# Patient Record
Sex: Male | Born: 1960 | Race: Black or African American | Hispanic: No | Marital: Married | Smoking: Never smoker
Health system: Southern US, Community
[De-identification: ages and names within clinical notes are randomized; demographics above are authoritative.]

## PROBLEM LIST (undated history)

## (undated) DIAGNOSIS — A159 Respiratory tuberculosis unspecified: Secondary | ICD-10-CM

## (undated) HISTORY — DX: Respiratory tuberculosis unspecified: A15.9

---

## 1999-03-13 ENCOUNTER — Emergency Department (HOSPITAL_COMMUNITY): Admission: EM | Admit: 1999-03-13 | Discharge: 1999-03-13 | Payer: Self-pay | Admitting: Emergency Medicine

## 1999-03-13 ENCOUNTER — Encounter: Payer: Self-pay | Admitting: Emergency Medicine

## 2000-04-06 ENCOUNTER — Emergency Department (HOSPITAL_COMMUNITY): Admission: EM | Admit: 2000-04-06 | Discharge: 2000-04-06 | Payer: Self-pay | Admitting: Internal Medicine

## 2001-01-30 ENCOUNTER — Emergency Department (HOSPITAL_COMMUNITY): Admission: EM | Admit: 2001-01-30 | Discharge: 2001-01-30 | Payer: Self-pay | Admitting: Emergency Medicine

## 2002-04-14 ENCOUNTER — Emergency Department (HOSPITAL_COMMUNITY): Admission: EM | Admit: 2002-04-14 | Discharge: 2002-04-14 | Payer: Self-pay | Admitting: *Deleted

## 2003-11-17 ENCOUNTER — Emergency Department (HOSPITAL_COMMUNITY): Admission: EM | Admit: 2003-11-17 | Discharge: 2003-11-17 | Payer: Self-pay | Admitting: Emergency Medicine

## 2004-07-27 ENCOUNTER — Encounter: Admission: RE | Admit: 2004-07-27 | Discharge: 2004-07-27 | Payer: Self-pay | Admitting: Family Medicine

## 2005-10-28 IMAGING — CT CT ANGIO CHEST
3 of 6 series · 18 of 30 positions shown · IV contrast ([ID] OMNI 300)
Comparison: None.

CLINICAL DATA: Short of breath.  Chest pain.
 CT ANGIO CHEST:
 Multidetector CT imaging of the chest was performed according to the protocol for detection of pulmonary embolism during IV bolus injection of 150 ml Omnipaque 300.  Coronal and sagittal plane reformatted images were also generated.

[Series 4: pe study · axial · 0.70mm/px · z∈[-241,-14]mm · 7 of 129 slices shown]
[im 19/129  lung]
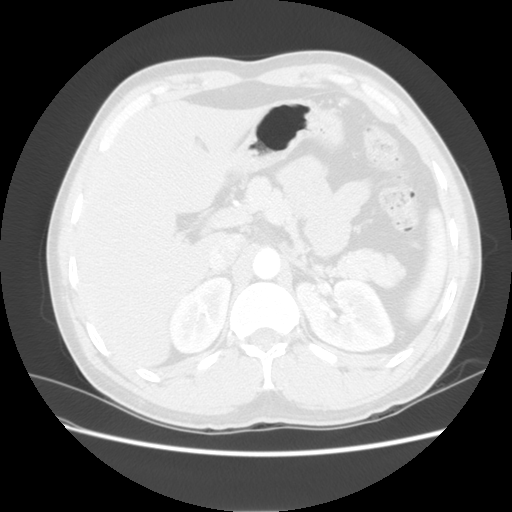
[im 37/129  mediastinal]
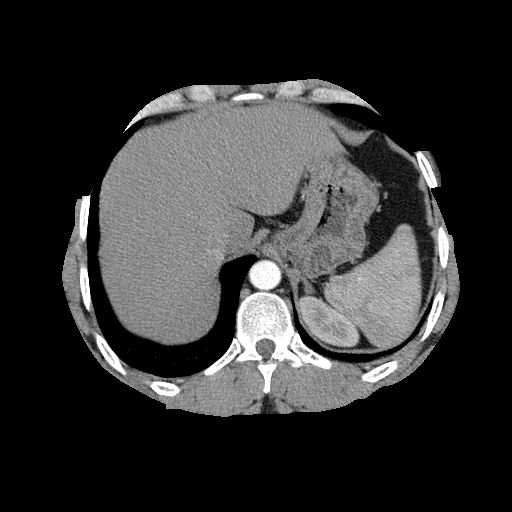
[im 55/129  lung]
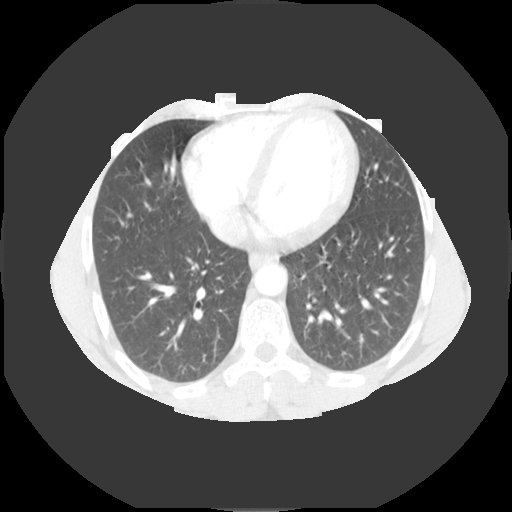
[im 66/129  mediastinal]
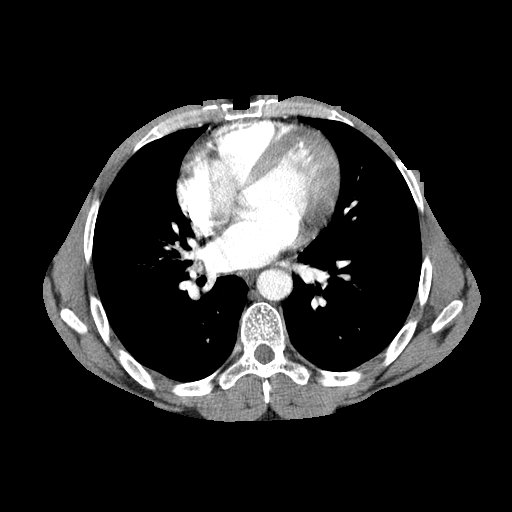
[im 74/129  lung]
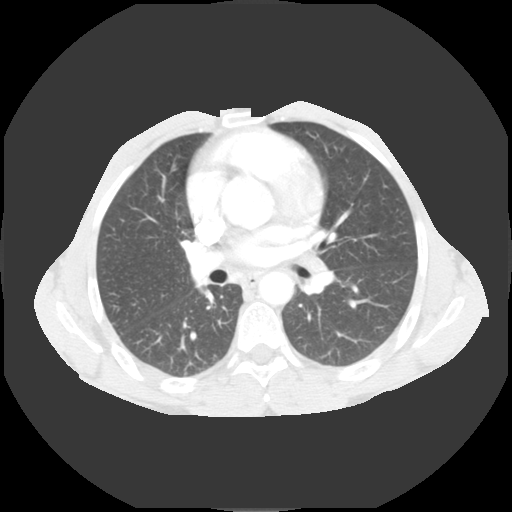
[im 92/129  mediastinal]
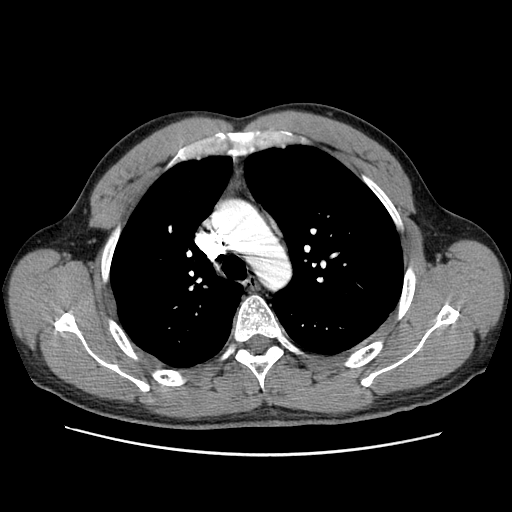
[im 110/129  lung]
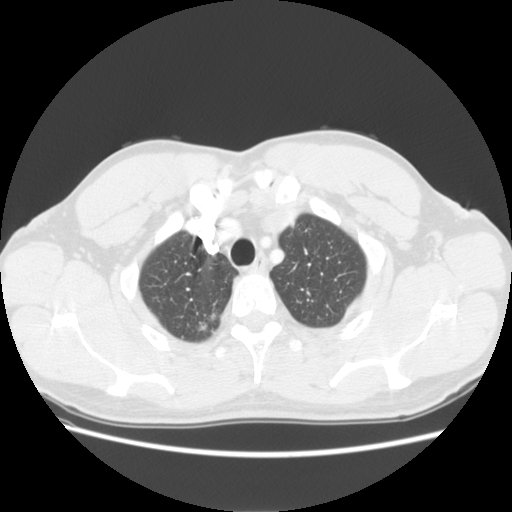

[Series 6: recon 3: pe study · axial · 0.70mm/px · z∈[-204,-48]mm · 8 of 161 slices shown]
[im 18/161  lung]
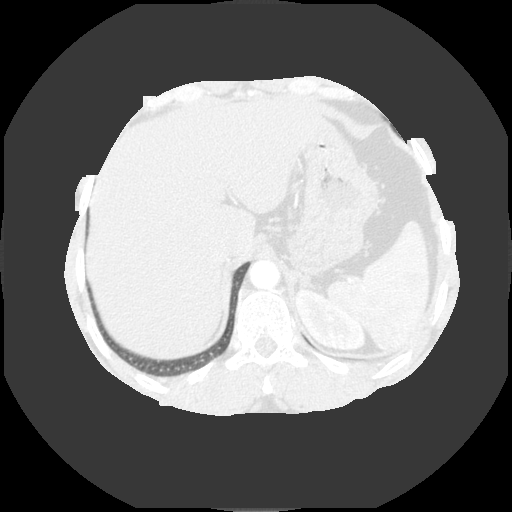
[im 36/161  lung]
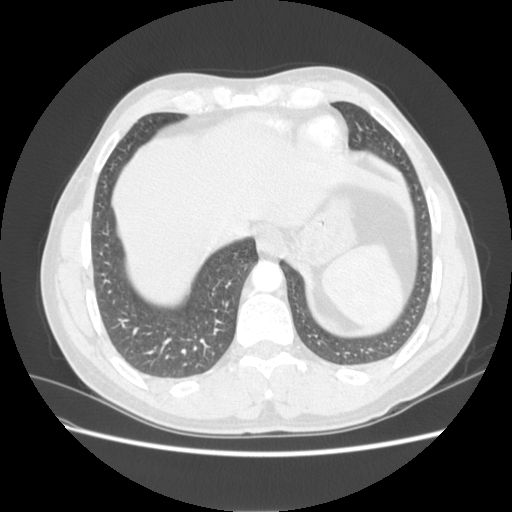
[im 54/161  lung]
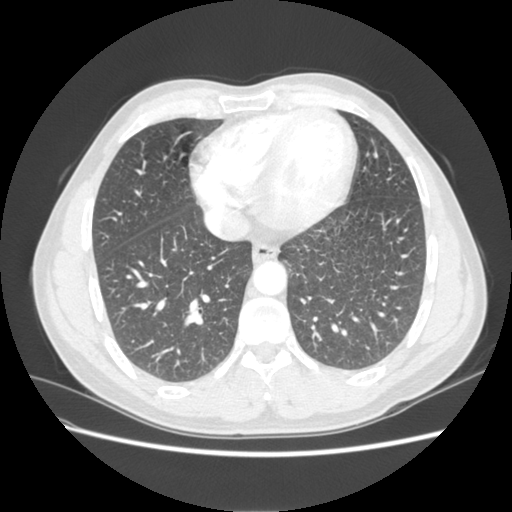
[im 72/161  lung]
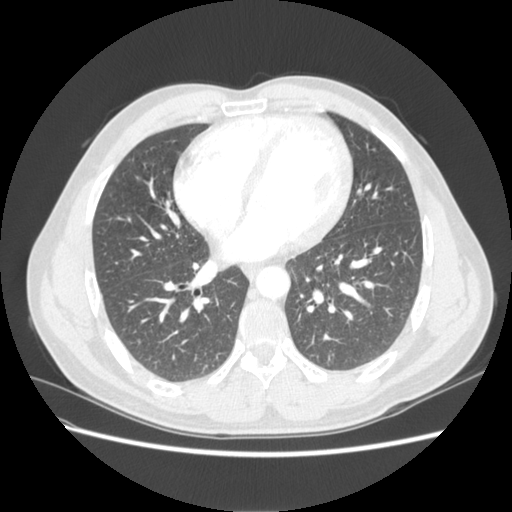
[im 89/161  lung]
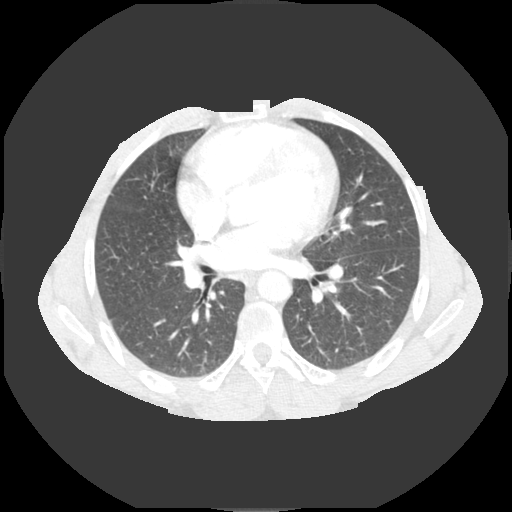
[im 107/161  lung]
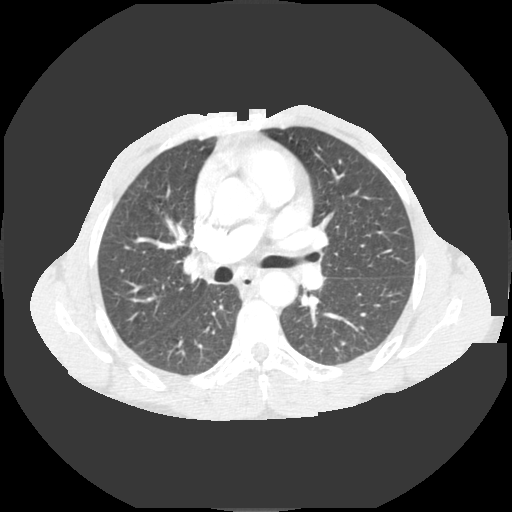
[im 125/161  lung]
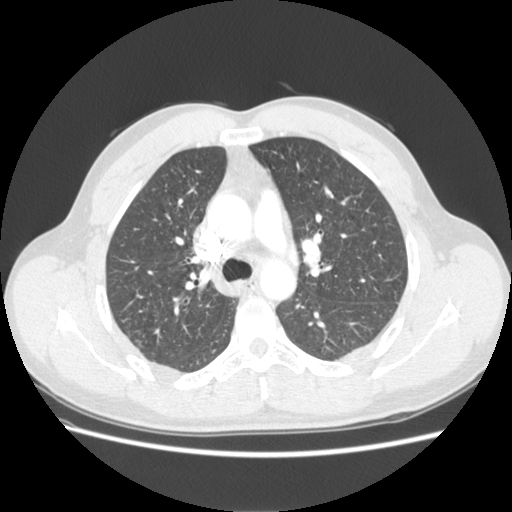
[im 143/161  lung]
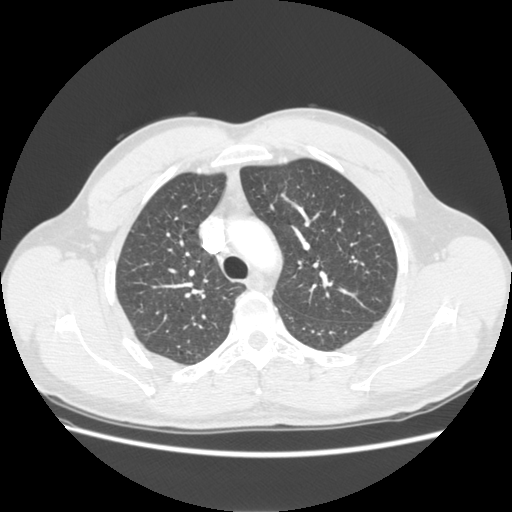

[Series 601: reformatted · sagittal · 0.70mm/px · 3 of 76 slices shown]
[im 19/76  lung]
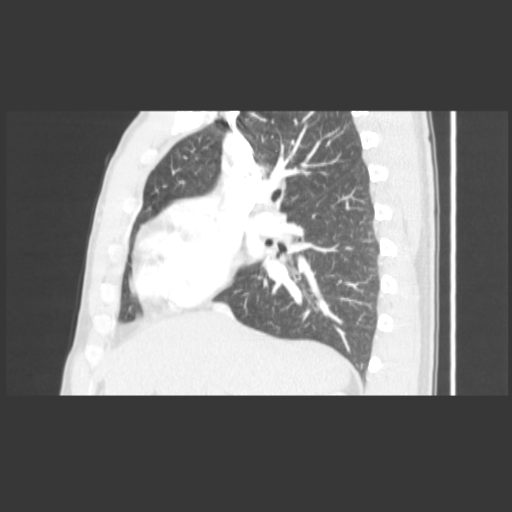
[im 38/76  lung]
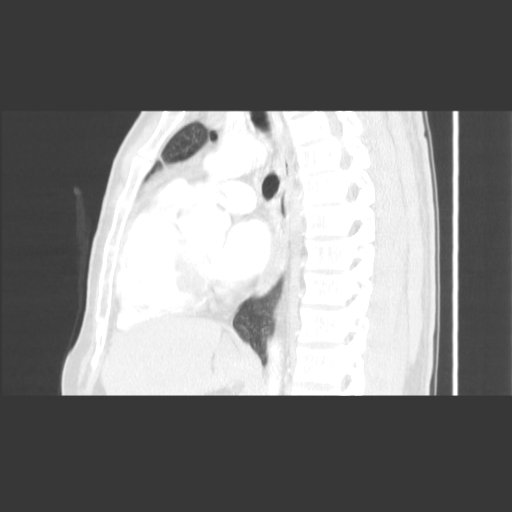
[im 57/76  lung]
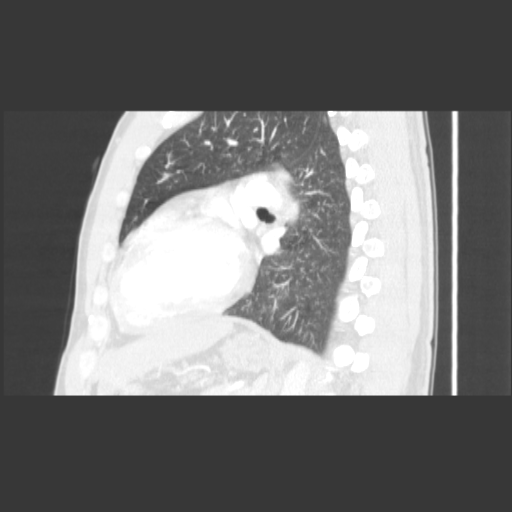

[18 of 30 positions shown; findings below may reference images not displayed]

In three planes, there are no definite pulmonary emboli.  CT cannot reliably exclude small peripheral emboli, but the main right and left pulmonary arteries and their first and second order divisions show no definite filling defects.  Lungs are clear.  No pleural or pericardial fluid.  No mediastinal adenopathy of significance.  There are some scattered subcentimeter nodes in the mediastinum and hila.
IMPRESSION: 1.  No definite pulmonary emboli.  
 2.  Lungs are clear.

## 2011-08-31 ENCOUNTER — Ambulatory Visit
Admission: RE | Admit: 2011-08-31 | Discharge: 2011-08-31 | Disposition: A | Discharge status: Home / Self Care | Payer: No Typology Code available for payment source | Source: Ambulatory Visit | Attending: Specialist | Admitting: Specialist

## 2011-08-31 ENCOUNTER — Other Ambulatory Visit: Payer: Self-pay | Admitting: Specialist

## 2011-08-31 DIAGNOSIS — R7611 Nonspecific reaction to tuberculin skin test without active tuberculosis: Secondary | ICD-10-CM

## 2011-10-15 ENCOUNTER — Ambulatory Visit: Payer: Self-pay | Admitting: Internal Medicine

## 2011-10-15 VITALS — BP 120/90 | HR 71 | Temp 98.8°F | Resp 16 | Ht 70.5 in | Wt 178.0 lb

## 2011-10-15 DIAGNOSIS — R197 Diarrhea, unspecified: Secondary | ICD-10-CM

## 2011-10-15 LAB — COMPREHENSIVE METABOLIC PANEL
ALT: 69 U/L — ABNORMAL HIGH (ref 0–53)
AST: 79 U/L — ABNORMAL HIGH (ref 0–37)
Albumin: 4.9 g/dL (ref 3.5–5.2)
Alkaline Phosphatase: 70 U/L (ref 39–117)
Glucose, Bld: 94 mg/dL (ref 70–99)
Potassium: 4.1 mEq/L (ref 3.5–5.3)
Sodium: 135 mEq/L (ref 135–145)
Total Bilirubin: 0.5 mg/dL (ref 0.3–1.2)
Total Protein: 8.3 g/dL (ref 6.0–8.3)

## 2011-10-15 LAB — POCT CBC
Granulocyte percent: 42.3 %G (ref 37–80)
HCT, POC: 46.6 % (ref 43.5–53.7)
Hemoglobin: 15.8 g/dL (ref 14.1–18.1)
Lymph, poc: 2.9 (ref 0.6–3.4)
MCHC: 33.9 g/dL (ref 31.8–35.4)
MPV: 7.8 fL (ref 0–99.8)
POC Granulocyte: 2.5 (ref 2–6.9)
POC LYMPH PERCENT: 49.9 %L (ref 10–50)
POC MID %: 7.8 %M (ref 0–12)
RDW, POC: 13 %

## 2011-10-15 MED ORDER — METRONIDAZOLE 500 MG PO TABS: 500.0000 mg | ORAL_TABLET | Freq: Three times a day (TID) | ORAL | Status: AC

## 2011-10-15 NOTE — Progress Notes (Signed)
  Subjective:    Patient ID: Sean Harper, male    DOB: 1961-08-15, 51 y.o.   MRN: [id number]  Diarrhea  This is a new problem. The current episode started in the past 7 days. The problem occurs 5 to 10 times per day. The problem has been unchanged. The stool consistency is described as watery. The patient states that diarrhea awakens him from sleep. Pertinent negatives include no abdominal pain, bloating, chills, coughing, fever, sweats, vomiting or weight loss. The symptoms are aggravated by nothing. Risk factors include recent antibiotic use. He has tried bismuth subsalicylate and anti-motility drug for the symptoms. The treatment provided no relief. There is no history of inflammatory bowel disease, irritable bowel syndrome or a recent abdominal surgery.      Review of Systems  Constitutional: Negative.  Negative for fever, chills and weight loss.  HENT: Negative.   Eyes: Negative.   Respiratory: Negative.  Negative for cough.   Cardiovascular: Negative.   Gastrointestinal: Positive for diarrhea. Negative for nausea, vomiting, abdominal pain, constipation, blood in stool, abdominal distention, anal bleeding and bloating.  Genitourinary: Negative.   Musculoskeletal: Negative.   Skin: Negative.   Neurological: Negative.        Objective:   Physical Exam  Constitutional: He is oriented to person, place, and time. He appears well-developed and well-nourished.       Pt is well hypdrated with an adequate standing BP showing no signs of severe dehydration.  HENT:  Head: Normocephalic and atraumatic.  Eyes: Conjunctivae are normal.  Neck: Neck supple. No thyromegaly present.  Cardiovascular: Normal rate, regular rhythm and normal heart sounds.   Pulmonary/Chest: Effort normal and breath sounds normal.  Abdominal: Soft. He exhibits no distension and no mass. There is no tenderness. There is no rebound and no guarding.       Bowel sounds are slightly hyperactive during my exam, but his  abdomen is soft without pain when examined.  Musculoskeletal: Normal range of motion.  Neurological: He is alert and oriented to person, place, and time.  Skin: Skin is warm and dry.          Assessment & Plan:  Diarrhea, I strongly suspect this is C. Difficile caused by his Rifampin which he has been on for about two months.  [facility] to apprise her of Sean Harper condition.  Stool cultures was obtained in the office.  We will treat him presumptively for C. Difficile and monitor him until the cultures return.  Pt phone [phone].

## 2011-10-15 NOTE — Patient Instructions (Signed)
Please stop Rifampin.  Return the stool cultures to the clinic as soon as possible and we will then give you a prescription (or e-prescribe) Metronidazole 500 mg  One TID for 10 days.

## 2011-10-20 ENCOUNTER — Telehealth: Payer: Self-pay

## 2011-10-20 NOTE — Telephone Encounter (Signed)
.  umfc  Margaret at [facility] stated we stopped this patients TB Meds - this patient needs to be on the meds and requests we reinstate them immediately Please call direct line at [phone]

## 2011-10-20 NOTE — Telephone Encounter (Signed)
LMOM FOR MARGARET TO CALL BACK. PER SARAH NELSON NOTE:  SHE SUSPECTED C. DIFFICILE WHICH SHE THINKS WAS CAUSED BY RIFAMPIN.  THAT'S WHY PT WAS STOPPING THE MED.  SARAH ALSO STATED IN HER NOTE THAT SHE CALLED THE [facility]ALTH [facility].

## 2011-10-21 NOTE — Telephone Encounter (Signed)
Eddie Candle, PA-C spoke with Sean Harper at the [facility] and was told Mr. Orlick was feeling well without diarrhea.  She requested permission to return him to Rifampin treatment which he has about 2 more months to complete.  I advised her of his recent lab results and symptoms.  She will discuss the pros and cons of putting him back on Rifampin with the MD there and proceed from there.

## 2012-01-29 ENCOUNTER — Emergency Department (HOSPITAL_COMMUNITY)
Admission: EM | Admit: 2012-01-29 | Discharge: 2012-01-29 | Disposition: A | Discharge status: Home / Self Care | Payer: Self-pay | Attending: Emergency Medicine | Admitting: Emergency Medicine

## 2012-01-29 ENCOUNTER — Encounter (HOSPITAL_COMMUNITY): Payer: Self-pay | Admitting: Emergency Medicine

## 2012-01-29 DIAGNOSIS — R51 Headache: Secondary | ICD-10-CM | POA: Insufficient documentation

## 2012-01-29 DIAGNOSIS — R599 Enlarged lymph nodes, unspecified: Secondary | ICD-10-CM | POA: Insufficient documentation

## 2012-01-29 DIAGNOSIS — J020 Streptococcal pharyngitis: Secondary | ICD-10-CM | POA: Insufficient documentation

## 2012-01-29 DIAGNOSIS — R509 Fever, unspecified: Secondary | ICD-10-CM | POA: Insufficient documentation

## 2012-01-29 MED ORDER — PENICILLIN G BENZATHINE 1200000 UNIT/2ML IM SUSP
1.2000 10*6.[IU] | Freq: Once | INTRAMUSCULAR | Status: AC
  Administered 2012-01-29: 1.2 10*6.[IU] via INTRAMUSCULAR
  Filled 2012-01-29: qty 2

## 2012-01-29 NOTE — Discharge Instructions (Signed)

## 2012-01-29 NOTE — ED Notes (Signed)
No reaction noted. Pt alertx3 respirations easy non labored.

## 2012-01-29 NOTE — ED Notes (Signed)
Pt states he has a sore throat that started yesterday morning

## 2012-01-29 NOTE — ED Notes (Signed)
Pt alert, nad, c/o sore throat, onset last PM, states recent ill exposures to daughter, throat reddened

## 2012-01-29 NOTE — ED Provider Notes (Signed)
Medical screening examination/treatment/procedure(s) were performed by non-physician practitioner and as supervising physician I was immediately available for consultation/collaboration.  Cheri Guppy, MD 01/29/12 [phone]

## 2012-01-29 NOTE — ED Notes (Signed)
Lab states rapid strep in progress.

## 2012-01-29 NOTE — ED Provider Notes (Signed)
History     CSN: [id number]  Arrival date & time 01/29/12  4098   First MD Initiated Contact with Patient 01/29/12 0710      Chief Complaint  Patient presents with  . Sore Throat    (Consider location/radiation/quality/duration/timing/severity/associated sxs/prior treatment) Patient is a 51 y.o. male presenting with pharyngitis. The history is provided by the patient.  Sore Throat This is a new problem. The current episode started yesterday. The problem occurs constantly. The problem has been unchanged. Associated symptoms include arthralgias, chills, congestion, a fever, headaches, myalgias, a sore throat and swollen glands. Pertinent negatives include no abdominal pain, chest pain, coughing, joint swelling, nausea, neck pain, rash, vertigo, vomiting or weakness. The symptoms are aggravated by swallowing. Treatments tried: chloraseptic. The treatment provided mild relief.    Past Medical History  Diagnosis Date  . Tuberculosis     Positive PPD    History reviewed. No pertinent past surgical history.  History reviewed. No pertinent family history.  History  Substance Use Topics  . Smoking status: Never Smoker   . 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 Alcohol Use: No      Review of Systems  Constitutional: Positive for fever and chills.  HENT: Positive for congestion and sore throat. Negative for neck pain.   Respiratory: Negative for cough.   Cardiovascular: Negative for chest pain.  Gastrointestinal: Negative for nausea, vomiting and abdominal pain.  Musculoskeletal: Positive for myalgias and arthralgias. Negative for joint swelling.  Skin: Negative for rash.  Neurological: Positive for headaches. Negative for vertigo and weakness.    Allergies  Review of patient's allergies indicates no known allergies.  Home Medications   Current Outpatient Rx  Name Route Sig Dispense Refill  . METRONIDAZOLE 500  MG PO TABS Oral Take 500 mg by mouth 3 (three) times daily.    Marland Kitchen PHENOL 1.4 % MT LIQD Mouth/Throat Use as directed 1 spray in the mouth or throat as needed. For sore throat      BP 122/76  Pulse 89  Temp(Src) 98.6 F (37 C) (Oral)  Resp 18  SpO2 100%  Physical Exam  Nursing note reviewed. Constitutional: He is oriented to person, place, and time. He appears well-developed and well-nourished. No distress.       Vital signs are reviewed and are normal.   HENT:  Head: Normocephalic and atraumatic.  Right Ear: Hearing, tympanic membrane, external ear and ear canal normal.  Left Ear: Hearing, tympanic membrane, external ear and ear canal normal.  Nose: Right sinus exhibits frontal sinus tenderness. Right sinus exhibits no maxillary sinus tenderness. Left sinus exhibits frontal sinus tenderness. Left sinus exhibits no maxillary sinus tenderness.  Mouth/Throat: Uvula is midline and mucous membranes are normal. Posterior oropharyngeal erythema present. No oropharyngeal exudate, posterior oropharyngeal edema or tonsillar abscesses.  Eyes: Pupils are equal, round, and reactive to light.  Neck: Normal range of motion. Neck supple.  Cardiovascular: Normal rate, regular rhythm and normal heart sounds.   No murmur heard. Pulmonary/Chest: Effort normal and breath sounds normal. No respiratory distress. He has no wheezes. He has no rales.  Abdominal: Soft. Bowel sounds are normal. He exhibits no distension. There is no tenderness.  Musculoskeletal: He exhibits no edema and no tenderness.  Lymphadenopathy:    He has cervical adenopathy (anterior L>R).  Neurological: He is alert and oriented to person, place, and time.       Speech clear. MAEW.  Skin: Skin is warm and dry.  Psychiatric: He has a normal mood and affect.    ED Course  Procedures (including critical care time)  Labs Reviewed  RAPID STREP SCREEN - Abnormal; Notable for the following:    Streptococcus, Group A Screen (Direct)  POSITIVE (*)    All other components within normal limits   No results found.  Dx 1: Strep pharyngitis   MDM  Sore throat x 1 day with subjective fever, erythematous oropharynx, and cervical LAD. AF, VSS in ED. Non-toxic appearing.  Positive strep screen. Will tx- pt prefers single injection over po abx.      Shaaron Adler, PA-C 01/29/12 1026

## 2015-12-11 DIAGNOSIS — R7611 Nonspecific reaction to tuberculin skin test without active tuberculosis: Secondary | ICD-10-CM | POA: Insufficient documentation

## 2016-04-02 ENCOUNTER — Ambulatory Visit (HOSPITAL_COMMUNITY)
Admission: EM | Admit: 2016-04-02 | Discharge: 2016-04-02 | Disposition: A | Discharge status: Home / Self Care | Payer: Self-pay | Attending: Family Medicine | Admitting: Family Medicine

## 2016-04-02 ENCOUNTER — Encounter (HOSPITAL_COMMUNITY): Payer: Self-pay | Admitting: Emergency Medicine

## 2016-04-02 DIAGNOSIS — Z79899 Other long term (current) drug therapy: Secondary | ICD-10-CM | POA: Insufficient documentation

## 2016-04-02 DIAGNOSIS — J029 Acute pharyngitis, unspecified: Secondary | ICD-10-CM

## 2016-04-02 LAB — POCT RAPID STREP A: Streptococcus, Group A Screen (Direct): NEGATIVE

## 2016-04-02 MED ORDER — AMOXICILLIN 875 MG PO TABS: 875.0000 mg | ORAL_TABLET | Freq: Two times a day (BID) | ORAL | Status: DC

## 2016-04-02 MED ORDER — LIDOCAINE VISCOUS 2 % MT SOLN: 20.0000 mL | OROMUCOSAL | Status: DC | PRN

## 2016-04-02 NOTE — Discharge Instructions (Signed)

## 2016-04-02 NOTE — ED Provider Notes (Signed)
CSN: [id number]     Arrival date & time 04/02/16  1001 History   First MD Initiated Contact with Patient 04/02/16 1022     No chief complaint on file.  (Consider location/radiation/quality/duration/timing/severity/associated sxs/prior Treatment) Patient is a 55 y.o. male presenting with pharyngitis. The history is provided by the patient.  Sore Throat This is a new problem. The current episode started more than 2 days ago. The problem occurs constantly. The problem has been gradually worsening. Nothing aggravates the symptoms. Nothing relieves the symptoms. He has tried acetaminophen for the symptoms.    Past Medical History  Diagnosis Date  . Tuberculosis     Positive PPD   No past surgical history on file. No family history on file. Social History  Substance Use Topics  . Smoking status: Never Smoker   . 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 Alcohol Use: No    Review of Systems  Constitutional: Positive for fever and fatigue.  HENT: Positive for sore throat.   Eyes: Negative.   Respiratory: Negative.   Cardiovascular: Negative.   Gastrointestinal: Negative.   Endocrine: Negative.   Musculoskeletal: Negative.   Skin: Negative.   Allergic/Immunologic: Negative.   Hematological: Negative.   Psychiatric/Behavioral: Negative.     Allergies  Review of patient's allergies indicates no known allergies.  Home Medications   Prior to Admission medications   Medication Sig Start Date End Date Taking? Authorizing Provider  metroNIDAZOLE (FLAGYL) 500 MG tablet Take 500 mg by mouth 3 (three) times daily.    Historical Provider, MD  phenol (CHLORASEPTIC) 1.4 % LIQD Use as directed 1 spray in the mouth or throat as needed. For sore throat    Historical Provider, MD   Meds Ordered and Administered this Visit  Medications - No data to display  There were no vitals taken for this visit. No data found.   Physical  Exam  Constitutional: He appears well-developed and well-nourished.  HENT:  Head: Normocephalic.  Right Ear: External ear normal.  Left Ear: External ear normal.  OPX - posterior pharynx injected swollen with petichiae upper palate and bilateral tonsil 2 plus without exudates.  Lymphadenopathy:    He has cervical adenopathy.    ED Course  Procedures (including critical care time)  Labs Review Labs Reviewed - No data to display  Imaging Review No results found.   Visual Acuity Review  Right Eye Distance:   Left Eye Distance:   Bilateral Distance:    Right Eye Near:   Left Eye Near:    Bilateral Near:         MDM  Pharyngitis - Amoxicillin 875mg  one po bid x  10 days #20 Lidocaine viscous solution 20 ml po bid prn pain #20 Push po fluids, rest, tylenol and motrin otc prn as directed for fever, arthralgias, and myalgias.  Follow up prn if sx's continue or persist.    Deatra CanterWilliam J Breland Trouten, FNP 04/02/16 1109

## 2016-04-02 NOTE — ED Notes (Signed)
[facility] with a complaint of a sore throat for 3 days that had a fever associated the first 2 days. The patient stated that he has been taking tylenol and ibuprofen for the fever and pain.

## 2016-04-04 LAB — CULTURE, GROUP A STREP (THRC)

## 2018-03-09 ENCOUNTER — Encounter: Payer: Self-pay | Admitting: Family Medicine

## 2018-03-09 ENCOUNTER — Ambulatory Visit: Payer: BLUE CROSS/BLUE SHIELD | Admitting: Family Medicine

## 2018-03-09 VITALS — BP 104/76 | HR 60 | Temp 97.9°F | Ht 70.0 in | Wt 195.0 lb

## 2018-03-09 DIAGNOSIS — Z125 Encounter for screening for malignant neoplasm of prostate: Secondary | ICD-10-CM | POA: Diagnosis not present

## 2018-03-09 DIAGNOSIS — E559 Vitamin D deficiency, unspecified: Secondary | ICD-10-CM

## 2018-03-09 DIAGNOSIS — Z131 Encounter for screening for diabetes mellitus: Secondary | ICD-10-CM | POA: Diagnosis not present

## 2018-03-09 DIAGNOSIS — Z13220 Encounter for screening for lipoid disorders: Secondary | ICD-10-CM

## 2018-03-09 DIAGNOSIS — Z0000 Encounter for general adult medical examination without abnormal findings: Secondary | ICD-10-CM

## 2018-03-09 LAB — LIPID PANEL
Cholesterol: 183 mg/dL (ref 0–200)
HDL: 42.3 mg/dL (ref >39.00)
LDL Cholesterol: 109 mg/dL — ABNORMAL HIGH (ref 0–99)
NONHDL: 141.12
Total CHOL/HDL Ratio: 4
Triglycerides: 162 mg/dL — ABNORMAL HIGH (ref 0.0–149.0)
VLDL: 32.4 mg/dL (ref 0.0–40.0)

## 2018-03-09 LAB — CBC WITH DIFFERENTIAL/PLATELET
BASOS PCT: 0.7 % (ref 0.0–3.0)
Basophils Absolute: 0 10*3/uL (ref 0.0–0.1)
EOS PCT: 1.6 % (ref 0.0–5.0)
Eosinophils Absolute: 0.1 10*3/uL (ref 0.0–0.7)
HCT: 41.9 % (ref 39.0–52.0)
Hemoglobin: 14.3 g/dL (ref 13.0–17.0)
LYMPHS ABS: 2.9 10*3/uL (ref 0.7–4.0)
Lymphocytes Relative: 58.5 % — ABNORMAL HIGH (ref 12.0–46.0)
MCHC: 34 g/dL (ref 30.0–36.0)
MCV: 85.4 fl (ref 78.0–100.0)
MONO ABS: 0.5 10*3/uL (ref 0.1–1.0)
Monocytes Relative: 9.3 % (ref 3.0–12.0)
NEUTROS PCT: 29.9 % — AB (ref 43.0–77.0)
Neutro Abs: 1.5 10*3/uL (ref 1.4–7.7)
Platelets: 261 10*3/uL (ref 150.0–400.0)
RBC: 4.91 Mil/uL (ref 4.22–5.81)
RDW: 13.1 % (ref 11.5–15.5)
WBC: 5 10*3/uL (ref 4.0–10.5)

## 2018-03-09 LAB — BASIC METABOLIC PANEL
BUN: 15 mg/dL (ref 6–23)
CHLORIDE: 102 meq/L (ref 96–112)
CO2: 31 meq/L (ref 19–32)
Calcium: 9.4 mg/dL (ref 8.4–10.5)
Creatinine, Ser: 1.02 mg/dL (ref 0.40–1.50)
GFR: 96.31 mL/min (ref >60.00)
Glucose, Bld: 90 mg/dL (ref 70–99)
Potassium: 4 mEq/L (ref 3.5–5.1)
SODIUM: 139 meq/L (ref 135–145)

## 2018-03-09 LAB — PSA: PSA: 0.49 ng/mL (ref 0.10–4.00)

## 2018-03-09 LAB — HEMOGLOBIN A1C: HEMOGLOBIN A1C: 5.7 % (ref 4.6–6.5)

## 2018-03-09 LAB — VITAMIN D 25 HYDROXY (VIT D DEFICIENCY, FRACTURES): VITD: 12.63 ng/mL — AB (ref 30.00–100.00)

## 2018-03-09 NOTE — Progress Notes (Signed)
Subjective:     Sean Harper is a 57 y.o. male and is here for a comprehensive physical exam. The patient reports no problems.  Patient was previously seen at [facility].  Pt states he is overall healthy and just wanted to get checked prior to going to [place] next month.  Patient is married.  He currently works for [employer] and [employer].  Pt endorses feeling tired as he works various shifts.  He may wake up at 4 in the morning work for a few hours and sleep for 2 hours and go back to work.  Pt also endorses being told he snores.  Pt is originally from [place]  Pt denies tobacco, alcohol, drug use.  Social History   Socioeconomic History  . Marital status: Married    Spouse name: Not on file  . Number of children: Not on file  . Years of education: Not on file  . Highest education level: Not on file  Occupational History  . Not on file  Social Needs  . Financial resource strain: Not on file  . Food insecurity:    Worry: Not on file    Inability: Not on file  . Transportation needs:    Medical: Not on file    Non-medical: Not on file  Tobacco Use  . Smoking status: Never Smoker  . Smokeless tobacco: Never Used  . 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 Alcohol use: No  . Drug use: No  . Sexual activity: Not on file  Lifestyle  . Physical activity:    Days per week: Not on file    Minutes per session: Not on file  . Stress: Not on file  Relationships  . Social connections:    Talks on phone: Not on file    Gets together: Not on file    Attends religious service: Not on file    Active member of club or organization: Not on file    Attends meetings of clubs or organizations: Not on file    Relationship status: Not on file  . Intimate partner violence:    Fear of current or ex partner: Not on file    Emotionally abused: Not on file    Physically abused: Not on file    Forced sexual activity: Not on file   Other Topics Concern  . Not on file  Social History Narrative  . Not on file   Health Maintenance  Topic Date Due  . Hepatitis C Screening  Dec 03, 1960  . HIV Screening  09/13/1974  . TETANUS/TDAP  09/13/1978  . COLONOSCOPY  09/13/2009  . INFLUENZA VACCINE  04/13/2018    The following portions of the patient's history were reviewed and updated as appropriate: allergies, current medications, past family history, past medical history, past social history, past surgical history and problem list.  Review of Systems A comprehensive review of systems was negative.   Objective:    BP 104/76 (BP Location: Left Arm, Patient Position: Sitting, Cuff Size: Normal)   Pulse 60   Temp 97.9 F (36.6 C) (Oral)   Ht 5\' 10"  (1.778 m)   Wt 195 lb (88.5 kg)   SpO2 98%   BMI 27.98 kg/m  General appearance: alert and cooperative, in NAD Head: Normocephalic, without obvious abnormality, atraumatic Eyes: conjunctivae/corneas clear. PERRL, EOM's intact. Fundi benign. Ears: normal TM's and external ear canals both ears Nose: Nares normal. Septum midline. Mucosa normal. No  drainage or sinus tenderness. Throat: lips, mucosa, and tongue normal; teeth and gums normal Neck: no adenopathy, no JVD, supple, symmetrical, trachea midline and thyroid not enlarged, symmetric, no tenderness/mass/nodules Lungs: clear to auscultation bilaterally Heart: regular rate and rhythm, S1, S2 normal, no murmur, click, rub or gallop Abdomen: soft, non-tender; bowel sounds normal; no masses,  no organomegaly Extremities: extremities normal, atraumatic, no cyanosis or edema Skin: Skin color, texture, turgor normal. No rashes or lesions Neurologic: Alert and oriented X 3, normal strength and tone. Normal symmetric reflexes. Normal coordination and gait    Assessment:    Healthy male exam.      Plan:     Anticipatory guidance given including wearing seatbelts, smoke detectors in the home, increasing physical activity,  increasing p.o. intake of water and vegetables. -will obtain labs: lipids, CBC, BMP, Hgb A1C, PSA -colonoscopy up to date--done 2017 See After Visit Summary for Counseling Recommendations    Vitamin D def -will recheck vit D level  F/u prn.    Abbe Amsterdam, MD

## 2018-03-09 NOTE — Patient Instructions (Signed)

## 2018-03-14 ENCOUNTER — Other Ambulatory Visit: Payer: Self-pay | Admitting: Family Medicine

## 2018-03-14 DIAGNOSIS — E559 Vitamin D deficiency, unspecified: Secondary | ICD-10-CM

## 2018-03-14 MED ORDER — VITAMIN D (ERGOCALCIFEROL) 1.25 MG (50000 UNIT) PO CAPS: 50000.0000 [IU] | ORAL_CAPSULE | ORAL | 0 refills | Status: DC

## 2018-10-10 ENCOUNTER — Other Ambulatory Visit: Payer: Self-pay

## 2018-10-10 ENCOUNTER — Emergency Department (HOSPITAL_COMMUNITY): Payer: BLUE CROSS/BLUE SHIELD

## 2018-10-10 ENCOUNTER — Encounter (HOSPITAL_COMMUNITY): Payer: Self-pay

## 2018-10-10 ENCOUNTER — Emergency Department (HOSPITAL_COMMUNITY)
Admission: EM | Admit: 2018-10-10 | Discharge: 2018-10-10 | Disposition: A | Discharge status: Home / Self Care | Payer: BLUE CROSS/BLUE SHIELD | Attending: Emergency Medicine | Admitting: Emergency Medicine

## 2018-10-10 DIAGNOSIS — R197 Diarrhea, unspecified: Secondary | ICD-10-CM

## 2018-10-10 DIAGNOSIS — J069 Acute upper respiratory infection, unspecified: Secondary | ICD-10-CM

## 2018-10-10 DIAGNOSIS — Z79899 Other long term (current) drug therapy: Secondary | ICD-10-CM | POA: Insufficient documentation

## 2018-10-10 LAB — URINALYSIS, ROUTINE W REFLEX MICROSCOPIC
BILIRUBIN URINE: NEGATIVE
Glucose, UA: NEGATIVE mg/dL
HGB URINE DIPSTICK: NEGATIVE
Ketones, ur: 5 mg/dL — AB
NITRITE: NEGATIVE
Protein, ur: 30 mg/dL — AB
SPECIFIC GRAVITY, URINE: 1.03 (ref 1.005–1.030)
pH: 6 (ref 5.0–8.0)

## 2018-10-10 LAB — COMPREHENSIVE METABOLIC PANEL
ALT: 28 U/L (ref 0–44)
ANION GAP: 10 (ref 5–15)
AST: 31 U/L (ref 15–41)
Albumin: 4.2 g/dL (ref 3.5–5.0)
Alkaline Phosphatase: 44 U/L (ref 38–126)
BUN: 16 mg/dL (ref 6–20)
CHLORIDE: 103 mmol/L (ref 98–111)
CO2: 25 mmol/L (ref 22–32)
CREATININE: 1.33 mg/dL — AB (ref 0.61–1.24)
Calcium: 8.7 mg/dL — ABNORMAL LOW (ref 8.9–10.3)
GFR, EST NON AFRICAN AMERICAN: 58 mL/min — AB (ref >60)
Glucose, Bld: 110 mg/dL — ABNORMAL HIGH (ref 70–99)
POTASSIUM: 4.2 mmol/L (ref 3.5–5.1)
SODIUM: 138 mmol/L (ref 135–145)
Total Bilirubin: 1 mg/dL (ref 0.3–1.2)
Total Protein: 7.5 g/dL (ref 6.5–8.1)

## 2018-10-10 LAB — CBC
HEMATOCRIT: 40.9 % (ref 39.0–52.0)
HEMOGLOBIN: 13.4 g/dL (ref 13.0–17.0)
MCH: 29.1 pg (ref 26.0–34.0)
MCHC: 32.8 g/dL (ref 30.0–36.0)
MCV: 88.7 fL (ref 80.0–100.0)
NRBC: 0 % (ref 0.0–0.2)
Platelets: 191 10*3/uL (ref 150–400)
RBC: 4.61 MIL/uL (ref 4.22–5.81)
RDW: 13 % (ref 11.5–15.5)
WBC: 10.7 10*3/uL — AB (ref 4.0–10.5)

## 2018-10-10 LAB — LIPASE, BLOOD: LIPASE: 33 U/L (ref 11–51)

## 2018-10-10 MED ORDER — SODIUM CHLORIDE 0.9% FLUSH: 3.0000 mL | Freq: Once | INTRAVENOUS | Status: DC

## 2018-10-10 NOTE — ED Notes (Signed)
ED Provider at bedside. 

## 2018-10-10 NOTE — ED Triage Notes (Signed)
Pt states that since yesterday, he has had a non-productive cough and been sneezing. Pt also states that he had 4 episodes of diarrhea yesterday, that was relieved with medication at home.

## 2018-10-10 NOTE — Discharge Instructions (Addendum)
Return if any problems.  See your Physician for recheck in 2-3 days °

## 2018-10-10 NOTE — ED Provider Notes (Signed)
[facility]-EMERGENCY DEPT Provider Note   CSN: [id number] Arrival date & time: 10/10/18  0931     History   Chief Complaint Chief Complaint  Patient presents with  . Cough  . Abdominal Pain    HPI Sean Harper is a 58 y.o. male.  The history is provided by the patient. No language interpreter was used.  Cough  Cough characteristics:  Productive Sputum characteristics:  Nondescript Severity:  Moderate Onset quality:  Gradual Duration:  2 days Timing:  Constant Progression:  Worsening Chronicity:  New Smoker: no   Relieved by:  Nothing Abdominal Pain  Associated symptoms: cough     Past Medical History:  Diagnosis Date  . Tuberculosis    Positive PPD    There are no active problems to display for this patient.   History reviewed. No pertinent surgical history.      Home Medications    Prior to Admission medications   Medication Sig Start Date End Date Taking? Authorizing Provider  Ascorbic Acid (VITAMIN C) 1000 MG tablet Take 1,000 mg by mouth daily.   Yes [provider]  Dextromethorphan-Menthol (DELSYM COUGH RELIEF MT) Use as directed 10 mLs in the mouth or throat daily as needed (cough).   Yes [provider]  metroNIDAZOLE (FLAGYL) 500 MG tablet Take 500 mg by mouth daily as needed.   Yes [provider]  Vitamin D, Ergocalciferol, (DRISDOL) 50000 units CAPS capsule Take 1 capsule (50,000 Units total) by mouth every 7 (seven) days. Patient not taking: Reported on 10/10/2018 03/14/18   [name], MD    Family History No family history on file.  Social History Social History   Tobacco Use  . Smoking status: Never Smoker  . Smokeless tobacco: Never Used  . 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 Alcohol use: No  . Drug use: No     Allergies   Patient has no known allergies.   Review of Systems Review of Systems  Respiratory:  Positive for cough.   Gastrointestinal: Positive for abdominal pain.  All other systems reviewed and are negative.    Physical Exam Updated Vital Signs BP 107/62   Pulse 85   Temp 99.3 F (37.4 C) (Oral)   Resp 16   Ht 5\' 8"  (1.727 m)   Wt 83.9 kg   SpO2 100%   BMI 28.13 kg/m   Physical Exam Vitals signs and nursing note reviewed.  Constitutional:      Appearance: He is well-developed.  HENT:     Head: Normocephalic.     Mouth/Throat:     Mouth: Mucous membranes are moist.  Eyes:     Extraocular Movements: Extraocular movements intact.  Neck:     Musculoskeletal: Normal range of motion.  Cardiovascular:     Rate and Rhythm: Normal rate.     Heart sounds: Normal heart sounds.  Pulmonary:     Effort: Pulmonary effort is normal.  Abdominal:     General: Abdomen is flat. Bowel sounds are normal. There is no distension.     Palpations: Abdomen is soft.     Tenderness: There is generalized abdominal tenderness.  Musculoskeletal: Normal range of motion.  Skin:    General: Skin is warm.  Neurological:     General: No focal deficit present.     Mental Status: He is alert and oriented to person, place, and time.  Psychiatric:  Mood and Affect: Mood normal.      ED Treatments / Results  Labs (all labs ordered are listed, but only abnormal results are displayed) Labs Reviewed  COMPREHENSIVE METABOLIC PANEL - Abnormal; Notable for the following components:      Result Value   Glucose, Bld 110 (*)    Creatinine, Ser 1.33 (*)    Calcium 8.7 (*)    GFR calc non Af Amer 58 (*)    All other components within normal limits  CBC - Abnormal; Notable for the following components:   WBC 10.7 (*)    All other components within normal limits  URINALYSIS, ROUTINE W REFLEX MICROSCOPIC - Abnormal; Notable for the following components:   Color, Urine AMBER (*)    APPearance HAZY (*)    Ketones, ur 5 (*)    Protein, ur 30 (*)    Leukocytes, UA TRACE (*)    Bacteria, UA  RARE (*)    All other components within normal limits  LIPASE, BLOOD    EKG None  Radiology Dg Chest 2 View  Result Date: 10/10/2018 CLINICAL DATA:  Chest pain, cough. EXAM: CHEST - 2 VIEW COMPARISON:  Radiograph of August 31, 2011. FINDINGS: The heart size and mediastinal contours are within normal limits. Both lungs are clear. No pneumothorax or pleural effusion is noted. The visualized skeletal structures are unremarkable. IMPRESSION: No active cardiopulmonary disease. Electronically Signed   By: Lupita Raider, M.D.   On: 10/10/2018 10:06    Procedures Procedures (including critical care time)  Medications Ordered in ED Medications  sodium chloride flush (NS) 0.9 % injection 3 mL (3 mLs Intravenous Not Given 10/10/18 1128)     Initial Impression / Assessment and Plan / ED Course  I have reviewed the triage vital signs and the nursing notes.  Pertinent labs & imaging results that were available during my care of the patient were reviewed by me and considered in my medical decision making (see chart for details).     MDM  Chest xray no pneumonia.  Labs reviewed.  I suspect viral illness   Final Clinical Impressions(s) / ED Diagnoses   Final diagnoses:  Viral upper respiratory tract infection  Diarrhea, unspecified type    ED Discharge Orders    None    An After Visit Summary was printed and given to the patient.   Osie Cheeks 10/10/18 1339    Gerhard Munch, MD 10/12/18 [phone]

## 2019-12-05 ENCOUNTER — Ambulatory Visit (INDEPENDENT_AMBULATORY_CARE_PROVIDER_SITE_OTHER): Payer: 59 | Admitting: Family Medicine

## 2019-12-05 ENCOUNTER — Other Ambulatory Visit (HOSPITAL_COMMUNITY)
Admission: RE | Admit: 2019-12-05 | Discharge: 2019-12-05 | Disposition: A | Discharge status: Home / Self Care | Payer: 59 | Source: Ambulatory Visit | Attending: Family Medicine | Admitting: Family Medicine

## 2019-12-05 ENCOUNTER — Other Ambulatory Visit: Payer: Self-pay

## 2019-12-05 ENCOUNTER — Encounter: Payer: Self-pay | Admitting: Family Medicine

## 2019-12-05 VITALS — BP 102/78 | HR 56 | Temp 97.8°F | Wt 200.0 lb

## 2019-12-05 DIAGNOSIS — Z125 Encounter for screening for malignant neoplasm of prostate: Secondary | ICD-10-CM

## 2019-12-05 DIAGNOSIS — Z113 Encounter for screening for infections with a predominantly sexual mode of transmission: Secondary | ICD-10-CM | POA: Diagnosis present

## 2019-12-05 DIAGNOSIS — Z13220 Encounter for screening for lipoid disorders: Secondary | ICD-10-CM | POA: Diagnosis not present

## 2019-12-05 DIAGNOSIS — Z0000 Encounter for general adult medical examination without abnormal findings: Secondary | ICD-10-CM

## 2019-12-05 DIAGNOSIS — Z131 Encounter for screening for diabetes mellitus: Secondary | ICD-10-CM

## 2019-12-05 DIAGNOSIS — Z1159 Encounter for screening for other viral diseases: Secondary | ICD-10-CM

## 2019-12-05 LAB — BASIC METABOLIC PANEL
BUN: 19 mg/dL (ref 6–23)
CO2: 30 mEq/L (ref 19–32)
Calcium: 9.4 mg/dL (ref 8.4–10.5)
Chloride: 101 mEq/L (ref 96–112)
Creatinine, Ser: 0.99 mg/dL (ref 0.40–1.50)
GFR: 93.23 mL/min (ref >60.00)
Glucose, Bld: 90 mg/dL (ref 70–99)
Potassium: 4.1 mEq/L (ref 3.5–5.1)
Sodium: 137 mEq/L (ref 135–145)

## 2019-12-05 LAB — CBC WITH DIFFERENTIAL/PLATELET
Basophils Absolute: 0 10*3/uL (ref 0.0–0.1)
Basophils Relative: 0.8 % (ref 0.0–3.0)
Eosinophils Absolute: 0.1 10*3/uL (ref 0.0–0.7)
Eosinophils Relative: 1.4 % (ref 0.0–5.0)
HCT: 40.9 % (ref 39.0–52.0)
Hemoglobin: 14.2 g/dL (ref 13.0–17.0)
Lymphocytes Relative: 62.4 % — ABNORMAL HIGH (ref 12.0–46.0)
Lymphs Abs: 2.9 10*3/uL (ref 0.7–4.0)
MCHC: 34.6 g/dL (ref 30.0–36.0)
MCV: 85.2 fl (ref 78.0–100.0)
Monocytes Absolute: 0.5 10*3/uL (ref 0.1–1.0)
Monocytes Relative: 10.3 % (ref 3.0–12.0)
Neutro Abs: 1.2 10*3/uL — ABNORMAL LOW (ref 1.4–7.7)
Neutrophils Relative %: 25.1 % — ABNORMAL LOW (ref 43.0–77.0)
Platelets: 253 10*3/uL (ref 150.0–400.0)
RBC: 4.8 Mil/uL (ref 4.22–5.81)
RDW: 13.1 % (ref 11.5–15.5)
WBC: 4.7 10*3/uL (ref 4.0–10.5)

## 2019-12-05 LAB — LIPID PANEL
Cholesterol: 181 mg/dL (ref 0–200)
HDL: 39.1 mg/dL (ref >39.00)
LDL Cholesterol: 116 mg/dL — ABNORMAL HIGH (ref 0–99)
NonHDL: 141.52
Total CHOL/HDL Ratio: 5
Triglycerides: 128 mg/dL (ref 0.0–149.0)
VLDL: 25.6 mg/dL (ref 0.0–40.0)

## 2019-12-05 LAB — HEMOGLOBIN A1C: Hgb A1c MFr Bld: 5.7 % (ref 4.6–6.5)

## 2019-12-05 LAB — PSA: PSA: 0.41 ng/mL (ref 0.10–4.00)

## 2019-12-05 NOTE — Patient Instructions (Signed)
Preventive Care 41-60 Years Old, Male Preventive care refers to lifestyle choices and visits with your health care provider that can promote health and wellness. This includes:  A yearly physical exam. This is also called an annual well check.  Regular dental and eye exams.  Immunizations.  Screening for certain conditions.  Healthy lifestyle choices, such as eating a healthy diet, getting regular exercise, not using drugs or products that contain nicotine and tobacco, and limiting alcohol use. What can I expect for my preventive care visit? Physical exam Your health care provider will check:  Height and weight. These may be used to calculate body mass index (BMI), which is a measurement that tells if you are at a healthy weight.  Heart rate and blood pressure.  Your skin for abnormal spots. Counseling Your health care provider may ask you questions about:  Alcohol, tobacco, and drug use.  Emotional well-being.  Home and relationship well-being.  Sexual activity.  Eating habits.  Work and work Statistician. What immunizations do I need?  Influenza (flu) vaccine  This is recommended every year. Tetanus, diphtheria, and pertussis (Tdap) vaccine  You may need a Td booster every 10 years. Varicella (chickenpox) vaccine  You may need this vaccine if you have not already been vaccinated. Zoster (shingles) vaccine  You may need this after age 64. Measles, mumps, and rubella (MMR) vaccine  You may need at least one dose of MMR if you were born in 1957 or later. You may also need a second dose. Pneumococcal conjugate (PCV13) vaccine  You may need this if you have certain conditions and were not previously vaccinated. Pneumococcal polysaccharide (PPSV23) vaccine  You may need one or two doses if you smoke cigarettes or if you have certain conditions. Meningococcal conjugate (MenACWY) vaccine  You may need this if you have certain conditions. Hepatitis A  vaccine  You may need this if you have certain conditions or if you travel or work in places where you may be exposed to hepatitis A. Hepatitis B vaccine  You may need this if you have certain conditions or if you travel or work in places where you may be exposed to hepatitis B. Haemophilus influenzae type b (Hib) vaccine  You may need this if you have certain risk factors. Human papillomavirus (HPV) vaccine  If recommended by your health care provider, you may need three doses over 6 months. You may receive vaccines as individual doses or as more than one vaccine together in one shot (combination vaccines). Talk with your health care provider about the risks and benefits of combination vaccines. What tests do I need? Blood tests  Lipid and cholesterol levels. These may be checked every 5 years, or more frequently if you are over 60 years old.  Hepatitis C test.  Hepatitis B test. Screening  Lung cancer screening. You may have this screening every year starting at age 43 if you have a 30-pack-year history of smoking and currently smoke or have quit within the past 15 years.  Prostate cancer screening. Recommendations will vary depending on your family history and other risks.  Colorectal cancer screening. All adults should have this screening starting at age 72 and continuing until age 2. Your health care provider may recommend screening at age 14 if you are at increased risk. You will have tests every 1-10 years, depending on your results and the type of screening test.  Diabetes screening. This is done by checking your blood sugar (glucose) after you have not eaten  for a while (fasting). You may have this done every 1-3 years.  Sexually transmitted disease (STD) testing. Follow these instructions at home: Eating and drinking  Eat a diet that includes fresh fruits and vegetables, whole grains, lean protein, and low-fat dairy products.  Take vitamin and mineral supplements as  recommended by your health care provider.  Do not drink alcohol if your health care provider tells you not to drink.  If you drink alcohol: ? Limit how much you have to 0-2 drinks a day. ? Be aware of how much alcohol is in your drink. In the [place] one drink equals one 12 oz bottle of beer (355 mL), one 5 oz glass of wine (148 mL), or one 1 oz glass of hard liquor (44 mL). Lifestyle  Take daily care of your teeth and gums.  Stay active. Exercise for at least 30 minutes on 5 or more days each week.  Do not use any products that contain nicotine or tobacco, such as cigarettes, e-cigarettes, and chewing tobacco. If you need help quitting, ask your health care provider.  If you are sexually active, practice safe sex. Use a condom or other form of protection to prevent STIs (sexually transmitted infections).  Talk with your health care provider about taking a low-dose aspirin every day starting at age 53. What's next?  Go to your health care provider once a year for a well check visit.  Ask your health care provider how often you should have your eyes and teeth checked.  Stay up to date on all vaccines. This information is not intended to replace advice given to you by your health care provider. Make sure you discuss any questions you have with your health care provider. Document Revised: 08/24/2018 Document Reviewed: 08/24/2018 Elsevier Patient Education  2020 [employer].

## 2019-12-05 NOTE — Progress Notes (Signed)
Subjective:     Sean Harper is a 59 y.o. male and is here for a comprehensive physical exam. The patient reports no problems.  Pt has a new job working with a company that works with [employer].  Pt leaving for over the wknd for [place] for a month.  Pt had a colonoscopy with [facility].  Unsure of date, but thinks was less than 10 years ago.  Social History   Socioeconomic History  . Marital status: Married    Spouse name: Not on file  . Number of children: Not on file  . Years of education: Not on file  . Highest education level: Not on file  Occupational History  . Not on file  Tobacco Use  . Smoking status: Never Smoker  . Smokeless tobacco: Never Used  . 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 Alcohol use: No  . Drug use: No  . Sexual activity: Not on file  Other Topics Concern  . Not on file  Social History Narrative  . Not on file   Social Determinants of Health   Financial Resource Strain:   . Difficulty of Paying Living Expenses:   Food Insecurity:   . Worried About Charity fundraiser in the Last Year:   . Arboriculturist in the Last Year:   Transportation Needs:   . Film/video editor (Medical):   Marland Kitchen Lack of Transportation (Non-Medical):   Physical Activity:   . Days of Exercise per Week:   . Minutes of Exercise per Session:   Stress:   . Feeling of Stress :   Social Connections:   . Frequency of Communication with Friends and Family:   . Frequency of Social Gatherings with Friends and Family:   . Attends Religious Services:   . Active Member of Clubs or Organizations:   . Attends Archivist Meetings:   Marland Kitchen Marital Status:   Intimate Partner Violence:   . Fear of Current or Ex-Partner:   . Emotionally Abused:   Marland Kitchen Physically Abused:   . Sexually Abused:    Health Maintenance  Topic Date Due  . Hepatitis C Screening  Never done  . HIV Screening  Never done  . TETANUS/TDAP  Never done   . COLONOSCOPY  Never done  . INFLUENZA VACCINE  Never done    The following portions of the patient's history were reviewed and updated as appropriate: allergies, current medications, past family history, past medical history, past social history, past surgical history and problem list.  Review of Systems Pertinent items noted in HPI and remainder of comprehensive ROS otherwise negative.   Objective:    BP 102/78 (BP Location: Left Arm, Patient Position: Sitting, Cuff Size: Large)   Pulse (!) 56   Temp 97.8 F (36.6 C) (Temporal)   Wt 200 lb (90.7 kg)   SpO2 98%   BMI 30.41 kg/m  General appearance: alert, cooperative and no distress Head: Normocephalic, without obvious abnormality, atraumatic Eyes: conjunctivae/corneas clear. PERRL, EOM's intact. Fundi benign. Ears: normal TM's and external ear canals both ears Nose: Nares normal. Septum midline. Mucosa normal. No drainage or sinus tenderness. Throat: lips, mucosa, and tongue normal; teeth and gums normal Neck: no adenopathy, no carotid bruit, no JVD, supple, symmetrical, trachea midline and thyroid not enlarged, symmetric, no tenderness/mass/nodules Lungs: clear to auscultation bilaterally Heart: regular rate and rhythm, S1, S2 normal, no murmur, click, rub or gallop Abdomen: soft,  non-tender; bowel sounds normal; no masses,  no organomegaly Extremities: extremities normal, atraumatic, no cyanosis or edema Pulses: 2+ and symmetric Skin: Skin color, texture, turgor normal. No rashes or lesions Lymph nodes: Cervical, supraclavicular, and axillary nodes normal. Neurologic: Alert and oriented X 3, normal strength and tone. Normal symmetric reflexes. Normal coordination and gait    Assessment:    Healthy male exam.      Plan:     Anticipatory guidance given including wearing seatbelts, smoke detectors in the home, increasing physical activity, increasing p.o. intake of water and vegetables. -will obtain labs -will check on  date of last Colonoscopy with [facility] physicians -given handout -next CPE in 1 yr See After Visit Summary for Counseling Recommendations    Screening for prostate cancer  - Plan: PSA  Screening for cholesterol level  - Plan: Lipid panel  Screening for diabetes mellitus  - Plan: Hemoglobin A1c  Need for hepatitis C screening test  - Plan: Hep C Antibody  Routine screening for STI (sexually transmitted infection)  - Plan: HIV antibody (with reflex), RPR, Urine cytology ancillary only  Follow-up as needed  Abbe Amsterdam, MD

## 2019-12-06 LAB — URINE CYTOLOGY ANCILLARY ONLY
Chlamydia: NEGATIVE
Comment: NEGATIVE
Comment: NORMAL
Neisseria Gonorrhea: NEGATIVE

## 2019-12-06 LAB — HIV ANTIBODY (ROUTINE TESTING W REFLEX): HIV 1&2 Ab, 4th Generation: NONREACTIVE

## 2019-12-06 LAB — HEPATITIS C ANTIBODY
Hepatitis C Ab: NONREACTIVE
SIGNAL TO CUT-OFF: 0.3 (ref <1.00)

## 2019-12-06 LAB — RPR: RPR Ser Ql: NONREACTIVE

## 2020-07-31 ENCOUNTER — Ambulatory Visit: Payer: 59 | Admitting: Family Medicine

## 2020-07-31 ENCOUNTER — Encounter: Payer: Self-pay | Admitting: Family Medicine

## 2020-07-31 ENCOUNTER — Other Ambulatory Visit: Payer: Self-pay

## 2020-07-31 VITALS — BP 104/72 | HR 64 | Temp 98.9°F | Wt 198.2 lb

## 2020-07-31 DIAGNOSIS — M545 Low back pain, unspecified: Secondary | ICD-10-CM

## 2020-07-31 DIAGNOSIS — L309 Dermatitis, unspecified: Secondary | ICD-10-CM

## 2020-07-31 MED ORDER — NYSTATIN 100000 UNIT/GM EX CREA
TOPICAL_CREAM | CUTANEOUS | 0 refills | Status: DC
Start: 2020-07-31 — End: 2022-08-13

## 2020-07-31 NOTE — Patient Instructions (Signed)
Acute Back Pain, Adult Acute back pain is sudden and usually short-lived. It is often caused by an injury to the muscles and tissues in the back. The injury may result from:  A muscle or ligament getting overstretched or torn (strained). Ligaments are tissues that connect bones to each other. Lifting something improperly can cause a back strain.  Wear and tear (degeneration) of the spinal disks. Spinal disks are circular tissue that provides cushioning between the bones of the spine (vertebrae).  Twisting motions, such as while playing sports or doing yard work.  A hit to the back.  Arthritis. You may have a physical exam, lab tests, and imaging tests to find the cause of your pain. Acute back pain usually goes away with rest and home care. Follow these instructions at home: Managing pain, stiffness, and swelling  Take over-the-counter and prescription medicines only as told by your health care provider.  Your health care provider may recommend applying ice during the first 24-48 hours after your pain starts. To do this: ? Put ice in a plastic bag. ? Place a towel between your skin and the bag. ? Leave the ice on for 20 minutes, 2-3 times a day.  If directed, apply heat to the affected area as often as told by your health care provider. Use the heat source that your health care provider recommends, such as a moist heat pack or a heating pad. ? Place a towel between your skin and the heat source. ? Leave the heat on for 20-30 minutes. ? Remove the heat if your skin turns bright red. This is especially important if you are unable to feel pain, heat, or cold. You have a greater risk of getting burned. Activity   Do not stay in bed. Staying in bed for more than 1-2 days can delay your recovery.  Sit up and stand up straight. Avoid leaning forward when you sit, or hunching over when you stand. ? If you work at a desk, sit close to it so you do not need to lean over. Keep your chin tucked  in. Keep your neck drawn back, and keep your elbows bent at a right angle. Your arms should look like the letter "L." ? Sit high and close to the steering wheel when you drive. Add lower back (lumbar) support to your car seat, if needed.  Take short walks on even surfaces as soon as you are able. Try to increase the length of time you walk each day.  Do not sit, drive, or stand in one place for more than 30 minutes at a time. Sitting or standing for long periods of time can put stress on your back.  Do not drive or use heavy machinery while taking prescription pain medicine.  Use proper lifting techniques. When you bend and lift, use positions that put less stress on your back: ? Bend your knees. ? Keep the load close to your body. ? Avoid twisting.  Exercise regularly as told by your health care provider. Exercising helps your back heal faster and helps prevent back injuries by keeping muscles strong and flexible.  Work with a physical therapist to make a safe exercise program, as recommended by your health care provider. Do any exercises as told by your physical therapist. Lifestyle  Maintain a healthy weight. Extra weight puts stress on your back and makes it difficult to have good posture.  Avoid activities or situations that make you feel anxious or stressed. Stress and anxiety increase muscle   tension and can make back pain worse. Learn ways to manage anxiety and stress, such as through exercise. General instructions  Sleep on a firm mattress in a comfortable position. Try lying on your side with your knees slightly bent. If you lie on your back, put a pillow under your knees.  Follow your treatment plan as told by your health care provider. This may include: ? Cognitive or behavioral therapy. ? Acupuncture or massage therapy. ? Meditation or yoga. Contact a health care provider if:  You have pain that is not relieved with rest or medicine.  You have increasing pain going down  into your legs or buttocks.  Your pain does not improve after 2 weeks.  You have pain at night.  You lose weight without trying.  You have a fever or chills. Get help right away if:  You develop new bowel or bladder control problems.  You have unusual weakness or numbness in your arms or legs.  You develop nausea or vomiting.  You develop abdominal pain.  You feel faint. Summary  Acute back pain is sudden and usually short-lived.  Use proper lifting techniques. When you bend and lift, use positions that put less stress on your back.  Take over-the-counter and prescription medicines and apply heat or ice as directed by your health care provider. This information is not intended to replace advice given to you by your health care provider. Make sure you discuss any questions you have with your health care provider. Document Revised: 12/19/2018 Document Reviewed: 04/13/2017 Elsevier Patient Education  2020 [employer]  Low Back Sprain or Strain Rehab Ask your health care provider which exercises are safe for you. Do exercises exactly as told by your health care provider and adjust them as directed. It is normal to feel mild stretching, pulling, tightness, or discomfort as you do these exercises. Stop right away if you feel sudden pain or your pain gets worse. Do not begin these exercises until told by your health care provider. Stretching and range-of-motion exercises These exercises warm up your muscles and joints and improve the movement and flexibility of your back. These exercises also help to relieve pain, numbness, and tingling. Lumbar rotation  1. Lie on your back on a firm surface and bend your knees. 2. Straighten your arms out to your sides so each arm forms a 90-degree angle (right angle) with a side of your body. 3. Slowly move (rotate) both of your knees to one side of your body until you feel a stretch in your lower back (lumbar). Try not to let your shoulders lift  off the floor. 4. Hold this position for __________ seconds. 5. Tense your abdominal muscles and slowly move your knees back to the starting position. 6. Repeat this exercise on the other side of your body. Repeat __________ times. Complete this exercise __________ times a day. Single knee to chest  1. Lie on your back on a firm surface with both legs straight. 2. Bend one of your knees. Use your hands to move your knee up toward your chest until you feel a gentle stretch in your lower back and buttock. ? Hold your leg in this position by holding on to the front of your knee. ? Keep your other leg as straight as possible. 3. Hold this position for __________ seconds. 4. Slowly return to the starting position. 5. Repeat with your other leg. Repeat __________ times. Complete this exercise __________ times a day. Prone extension on elbows  1.   Lie on your abdomen on a firm surface (prone position). 2. Prop yourself up on your elbows. 3. Use your arms to help lift your chest up until you feel a gentle stretch in your abdomen and your lower back. ? This will place some of your body weight on your elbows. If this is uncomfortable, try stacking pillows under your chest. ? Your hips should stay down, against the surface that you are lying on. Keep your hip and back muscles relaxed. 4. Hold this position for __________ seconds. 5. Slowly relax your upper body and return to the starting position. Repeat __________ times. Complete this exercise __________ times a day. Strengthening exercises These exercises build strength and endurance in your back. Endurance is the ability to use your muscles for a long time, even after they get tired. Pelvic tilt This exercise strengthens the muscles that lie deep in the abdomen. 1. Lie on your back on a firm surface. Bend your knees and keep your feet flat on the floor. 2. Tense your abdominal muscles. Tip your pelvis up toward the ceiling and flatten your lower  back into the floor. ? To help with this exercise, you may place a small towel under your lower back and try to push your back into the towel. 3. Hold this position for __________ seconds. 4. Let your muscles relax completely before you repeat this exercise. Repeat __________ times. Complete this exercise __________ times a day. Alternating arm and leg raises  1. Get on your hands and knees on a firm surface. If you are on a hard floor, you may want to use padding, such as an exercise mat, to cushion your knees. 2. Line up your arms and legs. Your hands should be directly below your shoulders, and your knees should be directly below your hips. 3. Lift your left leg behind you. At the same time, raise your right arm and straighten it in front of you. ? Do not lift your leg higher than your hip. ? Do not lift your arm higher than your shoulder. ? Keep your abdominal and back muscles tight. ? Keep your hips facing the ground. ? Do not arch your back. ? Keep your balance carefully, and do not hold your breath. 4. Hold this position for __________ seconds. 5. Slowly return to the starting position. 6. Repeat with your right leg and your left arm. Repeat __________ times. Complete this exercise __________ times a day. Abdominal set with straight leg raise  1. Lie on your back on a firm surface. 2. Bend one of your knees and keep your other leg straight. 3. Tense your abdominal muscles and lift your straight leg up, 4-6 inches (10-15 cm) off the ground. 4. Keep your abdominal muscles tight and hold this position for __________ seconds. ? Do not hold your breath. ? Do not arch your back. Keep it flat against the ground. 5. Keep your abdominal muscles tense as you slowly lower your leg back to the starting position. 6. Repeat with your other leg. Repeat __________ times. Complete this exercise __________ times a day. Single leg lower with bent knees 1. Lie on your back on a firm  surface. 2. Tense your abdominal muscles and lift your feet off the floor, one foot at a time, so your knees and hips are bent in 90-degree angles (right angles). ? Your knees should be over your hips and your lower legs should be parallel to the floor. 3. Keeping your abdominal muscles tense and your knee   bent, slowly lower one of your legs so your toe touches the ground. 4. Lift your leg back up to return to the starting position. ? Do not hold your breath. ? Do not let your back arch. Keep your back flat against the ground. 5. Repeat with your other leg. Repeat __________ times. Complete this exercise __________ times a day. Posture and body mechanics Good posture and healthy body mechanics can help to relieve stress in your body's tissues and joints. Body mechanics refers to the movements and positions of your body while you do your daily activities. Posture is part of body mechanics. Good posture means:  Your spine is in its natural S-curve position (neutral).  Your shoulders are pulled back slightly.  Your head is not tipped forward. Follow these guidelines to improve your posture and body mechanics in your everyday activities. Standing   When standing, keep your spine neutral and your feet about hip width apart. Keep a slight bend in your knees. Your ears, shoulders, and hips should line up.  When you do a task in which you stand in one place for a long time, place one foot up on a stable object that is 2-4 inches (5-10 cm) high, such as a footstool. This helps keep your spine neutral. Sitting   When sitting, keep your spine neutral and keep your feet flat on the floor. Use a footrest, if necessary, and keep your thighs parallel to the floor. Avoid rounding your shoulders, and avoid tilting your head forward.  When working at a desk or a computer, keep your desk at a height where your hands are slightly lower than your elbows. Slide your chair under your desk so you are close  enough to maintain good posture.  When working at a computer, place your monitor at a height where you are looking straight ahead and you do not have to tilt your head forward or downward to look at the screen. Resting  When lying down and resting, avoid positions that are most painful for you.  If you have pain with activities such as sitting, bending, stooping, or squatting, lie in a position in which your body does not bend very much. For example, avoid curling up on your side with your arms and knees near your chest (fetal position).  If you have pain with activities such as standing for a long time or reaching with your arms, lie with your spine in a neutral position and bend your knees slightly. Try the following positions: ? Lying on your side with a pillow between your knees. ? Lying on your back with a pillow under your knees. Lifting   When lifting objects, keep your feet at least shoulder width apart and tighten your abdominal muscles.  Bend your knees and hips and keep your spine neutral. It is important to lift using the strength of your legs, not your back. Do not lock your knees straight out.  Always ask for help to lift heavy or awkward objects. This information is not intended to replace advice given to you by your health care provider. Make sure you discuss any questions you have with your health care provider. Document Revised: 12/22/2018 Document Reviewed: 09/21/2018 Elsevier Patient Education  2020 [employer] Inc.  Skin Yeast Infection  A skin yeast infection is a condition in which there is an overgrowth of yeast (candida) that normally lives on the skin. This condition usually occurs in areas of the skin that are constantly warm and moist, such as  the armpits or the groin. What are the causes? This condition is caused by a change in the normal balance of the yeast and bacteria that live on the skin. What increases the risk? You are more likely to develop this  condition if you:  Are obese.  Are pregnant.  Take birth control pills.  Have diabetes.  Take antibiotic medicines.  Take steroid medicines.  Are malnourished.  Have a weak body defense system (immune system).  Are 5 years of age or older.  Wear tight clothing. What are the signs or symptoms? The most common symptom of this condition is itchiness in the affected area. Other symptoms include:  Red, swollen area of the skin.  Bumps on the skin. How is this diagnosed?  This condition is diagnosed with a medical history and physical exam.  Your health care provider may check for yeast by taking light scrapings of the skin to be viewed under a microscope. How is this treated? This condition is treated with medicine. Medicines may be prescribed or be available over the counter. The medicines may be:  Taken by mouth (orally).  Applied as a cream or powder to your skin. Follow these instructions at home:   Take or apply over-the-counter and prescription medicines only as told by your health care provider.  Maintain a healthy weight. If you need help losing weight, talk with your health care provider.  Keep your skin clean and dry.  If you have diabetes, keep your blood sugar under control.  Keep all follow-up visits as told by your health care provider. This is important. Contact a health care provider if:  Your symptoms go away and then return.  Your symptoms do not get better with treatment.  Your symptoms get worse.  Your rash spreads.  You have a fever or chills.  You have new symptoms.  You have new warmth or redness of your skin. Summary  A skin yeast infection is a condition in which there is an overgrowth of yeast (candida) that normally lives on the skin. This condition is caused by a change in the normal balance of the yeast and bacteria that live on the skin.  Take or apply over-the-counter and prescription medicines only as told by your health  care provider.  Keep your skin clean and dry.  Contact a health care provider if your symptoms do not get better with treatment. This information is not intended to replace advice given to you by your health care provider. Make sure you discuss any questions you have with your health care provider. Document Revised: 01/17/2018 Document Reviewed: 01/17/2018 Elsevier Patient Education  2020 [employer].

## 2020-07-31 NOTE — Progress Notes (Signed)
Subjective:    Patient ID: Sean Harper, male    DOB: [birth year], 60 y.o.   MRN: [id number]  No chief complaint on file.   HPI Patient was seen today for acute concern.  Patient endorses back pain times 3 days.  Symptoms occurred after patient tried to keep boxes from falling off a pallet at work.  Patient also drives a forklift at work.  Patient took Aleve for 3 days which helped symptoms improved.  Patient now able to bend without pain in low back bilaterally and midline.  Patient denied numbness, tingling, weakness in lower extremities, or loss of bowel or bladder.  Patient also notes itching and irritation in groin.  Patient endorses sweating groin 2/2 wearing multiple layers of clothing at work.  Patient denies any changes in soaps, lotions or detergents.  Patient states he typically does not use lotion.  Past Medical History:  Diagnosis Date   Tuberculosis    Positive PPD    No Known Allergies  ROS General: Denies fever, chills, night sweats, changes in weight, changes in appetite HEENT: Denies headaches, ear pain, changes in vision, rhinorrhea, sore throat CV: Denies CP, palpitations, SOB, orthopnea Pulm: Denies SOB, cough, wheezing GI: Denies abdominal pain, nausea, vomiting, diarrhea, constipation GU: Denies dysuria, hematuria, frequency Msk: Denies muscle cramps, joint pains  +back pain x 3 days Neuro: Denies weakness, numbness, tingling Skin: Denies rashes, bruising +pruritis in groin Psych: Denies depression, anxiety, hallucinations      Objective:    Blood pressure 104/72, pulse 64, temperature 98.9 F (37.2 C), temperature source Oral, weight 198 lb 3.2 oz (89.9 kg), SpO2 99 %.  Gen. Pleasant, well-nourished, in no distress, normal affect  HEENT: [place]/AT, face symmetric, conjunctiva clear, no scleral icterus, PERRLA, EOMI, nares patent without drainage Lungs: no accessory muscle use Cardiovascular: RRR, no peripheral edema Musculoskeletal: No TTP of cervical,  thoracic, lumbar, or paraspinal muscles.  No discomfort with flexion, extension, and lateral movements of spine.  No deformities, no cyanosis or clubbing, normal tone.  Strength equal and normal bilaterally Neuro:  A&Ox3, CN II-XII intact, normal gait Skin:  Warm, no lesions/ rash   Wt Readings from Last 3 Encounters:  12/05/19 200 lb (90.7 kg)  10/10/18 185 lb (83.9 kg)  03/09/18 195 lb (88.5 kg)    Lab Results  Component Value Date   WBC 4.7 12/05/2019   HGB 14.2 12/05/2019   HCT 40.9 12/05/2019   PLT 253.0 12/05/2019   GLUCOSE 90 12/05/2019   CHOL 181 12/05/2019   TRIG 128.0 12/05/2019   HDL 39.10 12/05/2019   LDLCALC 116 (H) 12/05/2019   ALT 28 10/10/2018   AST 31 10/10/2018   NA 137 12/05/2019   K 4.1 12/05/2019   CL 101 12/05/2019   CREATININE 0.99 12/05/2019   BUN 19 12/05/2019   CO2 30 12/05/2019   PSA 0.41 12/05/2019   HGBA1C 5.7 12/05/2019    Assessment/Plan:  Dermatitis -Patient encouraged to keep skin clean and dry. -Patient encouraged to use some type of moisturizer for skin to prevent pruritus - Plan: nystatin cream (MYCOSTATIN)  Lumbar back pain -2/2 latissimus dorsi strain -Improving/nearly resolved -Continue supportive care including heat, stretching, massage, NSAIDs as needed -Given handout for stretching exercises -Continue to monitor  F/u as needed  Abbe Amsterdam, MD

## 2021-03-24 ENCOUNTER — Other Ambulatory Visit: Payer: Self-pay

## 2021-03-25 ENCOUNTER — Encounter: Payer: Self-pay | Admitting: Family Medicine

## 2021-03-25 ENCOUNTER — Ambulatory Visit (INDEPENDENT_AMBULATORY_CARE_PROVIDER_SITE_OTHER): Payer: 59 | Admitting: Family Medicine

## 2021-03-25 VITALS — BP 122/74 | HR 53 | Temp 98.0°F | Ht 71.0 in | Wt 206.4 lb

## 2021-03-25 DIAGNOSIS — E7841 Elevated Lipoprotein(a): Secondary | ICD-10-CM

## 2021-03-25 DIAGNOSIS — Z0000 Encounter for general adult medical examination without abnormal findings: Secondary | ICD-10-CM | POA: Diagnosis not present

## 2021-03-25 DIAGNOSIS — Z125 Encounter for screening for malignant neoplasm of prostate: Secondary | ICD-10-CM | POA: Diagnosis not present

## 2021-03-25 DIAGNOSIS — E559 Vitamin D deficiency, unspecified: Secondary | ICD-10-CM | POA: Diagnosis not present

## 2021-03-25 LAB — CBC WITH DIFFERENTIAL/PLATELET
Basophils Absolute: 0 10*3/uL (ref 0.0–0.1)
Basophils Relative: 0.8 % (ref 0.0–3.0)
Eosinophils Absolute: 0.1 10*3/uL (ref 0.0–0.7)
Eosinophils Relative: 1.3 % (ref 0.0–5.0)
HCT: 39.2 % (ref 39.0–52.0)
Hemoglobin: 13.7 g/dL (ref 13.0–17.0)
Lymphocytes Relative: 59.5 % — ABNORMAL HIGH (ref 12.0–46.0)
Lymphs Abs: 3.2 10*3/uL (ref 0.7–4.0)
MCHC: 34.9 g/dL (ref 30.0–36.0)
MCV: 84.6 fl (ref 78.0–100.0)
Monocytes Absolute: 0.7 10*3/uL (ref 0.1–1.0)
Monocytes Relative: 12.5 % — ABNORMAL HIGH (ref 3.0–12.0)
Neutro Abs: 1.4 10*3/uL (ref 1.4–7.7)
Neutrophils Relative %: 25.9 % — ABNORMAL LOW (ref 43.0–77.0)
Platelets: 231 10*3/uL (ref 150.0–400.0)
RBC: 4.63 Mil/uL (ref 4.22–5.81)
RDW: 13.2 % (ref 11.5–15.5)
WBC: 5.4 10*3/uL (ref 4.0–10.5)

## 2021-03-25 LAB — VITAMIN D 25 HYDROXY (VIT D DEFICIENCY, FRACTURES): VITD: 14.41 ng/mL — ABNORMAL LOW (ref 30.00–100.00)

## 2021-03-25 LAB — LIPID PANEL
Cholesterol: 168 mg/dL (ref 0–200)
HDL: 37 mg/dL — ABNORMAL LOW (ref >39.00)
NonHDL: 131.01
Total CHOL/HDL Ratio: 5
Triglycerides: 236 mg/dL — ABNORMAL HIGH (ref 0.0–149.0)
VLDL: 47.2 mg/dL — ABNORMAL HIGH (ref 0.0–40.0)

## 2021-03-25 LAB — BASIC METABOLIC PANEL
BUN: 13 mg/dL (ref 6–23)
CO2: 27 mEq/L (ref 19–32)
Calcium: 9.1 mg/dL (ref 8.4–10.5)
Chloride: 104 mEq/L (ref 96–112)
Creatinine, Ser: 1.06 mg/dL (ref 0.40–1.50)
GFR: 75.74 mL/min (ref >60.00)
Glucose, Bld: 93 mg/dL (ref 70–99)
Potassium: 3.9 mEq/L (ref 3.5–5.1)
Sodium: 138 mEq/L (ref 135–145)

## 2021-03-25 LAB — TSH: TSH: 1.47 u[IU]/mL (ref 0.35–5.50)

## 2021-03-25 LAB — LDL CHOLESTEROL, DIRECT: Direct LDL: 88 mg/dL

## 2021-03-25 LAB — T4, FREE: Free T4: 0.95 ng/dL (ref 0.60–1.60)

## 2021-03-25 LAB — HEMOGLOBIN A1C: Hgb A1c MFr Bld: 5.9 % (ref 4.6–6.5)

## 2021-03-25 LAB — PSA: PSA: 0.5 ng/mL (ref 0.10–4.00)

## 2021-03-25 NOTE — Progress Notes (Signed)
Subjective:     Sean Harper is a 60 y.o. male and is here for a comprehensive physical exam.Pt states he is doing well.  Occasionally has an itchy sensation on bottom of R foot.  Changes socks daily, keeps feet clean and dry.  Does notice they sweat at times.  Wearing the same shoes to work daily x 1 yr.    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  Substance and Sexual Activity   Alcohol use: No   Drug use: No   Sexual activity: Not on file  Other Topics Concern   Not on file  Social History Narrative   Not on file   Social Determinants of Health   Financial Resource Strain: Not on file  Food Insecurity: Not on file  Transportation Needs: Not on file  Physical Activity: Not on file  Stress: Not on file  Social Connections: Not on file  Intimate Partner Violence: Not on file   Health Maintenance  Topic Date Due   TETANUS/TDAP  Never done   COLONOSCOPY (Pts 45-73yrs Insurance coverage will need to be confirmed)  Never done   Zoster Vaccines- Shingrix (1 of 2) Never done   COVID-19 Vaccine (2 - Booster for [employer] series) 06/17/2020   INFLUENZA VACCINE  04/13/2021   Hepatitis C Screening  Completed   HIV Screening  Completed   Pneumococcal Vaccine 68-62 Years old  Aged Out   HPV VACCINES  Aged Out    The following portions of the patient's history were reviewed and updated as appropriate: allergies, current medications, past family history, past medical history, past social history, past surgical history, and problem list.  Review of Systems Pertinent items noted in HPI and remainder of comprehensive ROS otherwise negative.    Objective:    BP 122/74 (BP Location: Left Arm, Patient Position: Sitting, Cuff Size: Normal)   Pulse (!) 53   Temp 98 F (36.7 C) (Oral)   Ht 5\' 11"  (1.803 m)   Wt 206 lb 6.4 oz (93.6 kg)   SpO2 98%   BMI 28.79 kg/m  General appearance: alert, cooperative, and no distress Head: Normocephalic, without obvious abnormality, atraumatic Eyes: conjunctivae/corneas clear. PERRL, EOM's intact. Fundi benign. Ears: Cerumen impaction in R canal.  normal L TM and external ear canals both ears  Nose: Nares normal. Septum midline. Mucosa normal. No drainage or sinus tenderness. Throat: lips, mucosa, and tongue normal; teeth and gums normal Neck: no adenopathy, no carotid bruit, no JVD, supple, symmetrical, trachea midline, and thyroid not enlarged, symmetric, no tenderness/mass/nodules Lungs: clear to auscultation bilaterally Heart: regular rate and rhythm, S1, S2 normal, no murmur, click, rub or gallop Abdomen: soft, non-tender; bowel sounds normal; no masses,  no organomegaly Extremities: extremities normal, atraumatic, no cyanosis or edema Pulses: 2+ and symmetric Skin: Skin color, texture, turgor normal. No rashes or lesions Lymph nodes: Cervical, supraclavicular, and axillary nodes normal. Neurologic: Alert and oriented X 3, normal strength and tone. Normal symmetric reflexes. Normal coordination and gait    Assessment:    Healthy male exam.  Plan:    Anticipatory guidance given including wearing seatbelts, smoke detectors in the home, increasing physical activity, increasing p.o. intake of water and vegetables. -will obtain labs -colonoscopy up to date -discussed immunizations.  Pt to consider. -will try OTC athlete's foot powder. -Given handout -Next CPE in 1 yr See After Visit Summary for Counseling Recommendations   - Plan: CBC with Differential/Platelet, TSH, T4, Free, Hemoglobin A1c, Basic metabolic panel  Vitamin D deficiency  -vit D 12.63 on 03/09/18 - Plan: Vitamin  D, 25-hydroxy  Screening for prostate cancer  - Plan: PSA  Elevated lipoprotein(a) -LDL 116 on 12/05/19 -lifestyle modifications  - Plan: Lipid panel  F/u prn  Abbe Amsterdam, MD

## 2021-03-27 ENCOUNTER — Other Ambulatory Visit: Payer: Self-pay | Admitting: Family Medicine

## 2021-03-27 DIAGNOSIS — E559 Vitamin D deficiency, unspecified: Secondary | ICD-10-CM

## 2021-03-27 MED ORDER — VITAMIN D (ERGOCALCIFEROL) 1.25 MG (50000 UNIT) PO CAPS: 50000.0000 [IU] | ORAL_CAPSULE | ORAL | 0 refills | Status: DC

## 2021-06-16 ENCOUNTER — Other Ambulatory Visit: Payer: Self-pay | Admitting: Family Medicine

## 2021-06-16 DIAGNOSIS — E559 Vitamin D deficiency, unspecified: Secondary | ICD-10-CM

## 2021-07-23 ENCOUNTER — Ambulatory Visit (INDEPENDENT_AMBULATORY_CARE_PROVIDER_SITE_OTHER): Payer: 59 | Admitting: Family Medicine

## 2021-07-23 ENCOUNTER — Encounter: Payer: Self-pay | Admitting: Family Medicine

## 2021-07-23 VITALS — BP 126/82 | HR 53 | Temp 98.1°F | Wt 209.2 lb

## 2021-07-23 DIAGNOSIS — M533 Sacrococcygeal disorders, not elsewhere classified: Secondary | ICD-10-CM

## 2021-07-23 MED ORDER — IBUPROFEN 600 MG PO TABS: 600.0000 mg | ORAL_TABLET | Freq: Three times a day (TID) | ORAL | 1 refills | Status: AC | PRN

## 2021-07-23 NOTE — Progress Notes (Signed)
Subjective:    Patient ID: Sean Harper, male    DOB: 1961-01-23, 60 y.o.   MRN: [id number]  Chief Complaint  Patient presents with   Hip Pain    Right hip pain, started Monday  night . Took ibuprofen 600mg , pain eased, went to work pain came back, took otc meds, pain came back. States it is constant, but medicine helps    HPI Patient was seen today for acute concern.  Pt with R sided pain in buttock down RLE to calf.  Denies back pain, numbness, tingling, or weakness in RLE.  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  Pt had discomfort going up stairs that evening. Took Ibuprofen 600 mg which helped.  The pain returned the next day.  Pain also occurs with bending or kneeling for prayer.  Pt wears steel toe shoes at work. 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    Past Medical History:  Diagnosis Date   Tuberculosis    Positive PPD    No Known Allergies  ROS General: Denies fever, chills, night sweats, changes in weight, changes in appetite HEENT: Denies headaches, ear pain, changes in vision, rhinorrhea, sore throat CV: Denies CP, palpitations, SOB, orthopnea Pulm: Denies SOB, cough, wheezing GI: Denies abdominal pain, nausea, vomiting, diarrhea, constipation GU: Denies dysuria, hematuria, frequency Msk: Denies muscle cramps, joint pains   + right LE pain Neuro: Denies weakness, numbness, tingling Skin: Denies rashes, bruising Psych: Denies depression, anxiety, hallucinations    Objective:    Blood pressure 126/82, pulse (!) 53, temperature 98.1 F (36.7 C), temperature source Oral, weight 209 lb 3.2 oz (94.9 kg), SpO2 97 %.  Gen. Pleasant, well-nourished, in no distress, normal affect   HEENT: [place]/AT, face symmetric, conjunctiva clear, no scleral icterus, PERRLA, EOMI, nares patent without drainage Lungs: no accessory muscle use Cardiovascular: RRR,  no peripheral edema Musculoskeletal: No TTP of cervical, thoracic, lumbar spine midline or  paraspinal muscles.  TTP over right SI joint.  No TTP of bilateral sciatic nerves, IT band numbness.  No deformities, no cyanosis or clubbing, normal tone Neuro:  A&Ox3, CN II-XII intact, normal gait Skin:  Warm, no lesions/ rash   Wt Readings from Last 3 Encounters:  07/23/21 209 lb 3.2 oz (94.9 kg)  03/25/21 206 lb 6.4 oz (93.6 kg)  07/31/20 198 lb 3.2 oz (89.9 kg)    Lab Results  Component Value Date   WBC 5.4 03/25/2021   HGB 13.7 03/25/2021   HCT 39.2 03/25/2021   PLT 231.0 03/25/2021   GLUCOSE 93 03/25/2021   CHOL 168 03/25/2021   TRIG 236.0 (H) 03/25/2021   HDL 37.00 (L) 03/25/2021   LDLDIRECT 88.0 03/25/2021   LDLCALC 116 (H) 12/05/2019   ALT 28 10/10/2018   AST 31 10/10/2018   NA 138 03/25/2021   K 3.9 03/25/2021   CL 104 03/25/2021   CREATININE 1.06 03/25/2021   BUN 13 03/25/2021   CO2 27 03/25/2021   TSH 1.47 03/25/2021   PSA 0.50 03/25/2021   HGBA1C 5.9 03/25/2021    Assessment/Plan:  Sacroiliac joint pain -Supportive care including NSAIDs, topical analgesics, rest, heat, ice, stretching -Discussed using insoles/supportive cushions in shoes for work -03/27/2021 -Given handouts -Follow-up as needed - Plan: ibuprofen (ADVIL) 600 MG tablet  F/u prn  Psychologist, clinical, MD

## 2021-10-01 ENCOUNTER — Encounter: Payer: Self-pay | Admitting: Family Medicine

## 2021-10-01 ENCOUNTER — Ambulatory Visit (INDEPENDENT_AMBULATORY_CARE_PROVIDER_SITE_OTHER): Payer: 59 | Admitting: Family Medicine

## 2021-10-01 VITALS — BP 122/84 | HR 61 | Temp 97.7°F | Wt 206.2 lb

## 2021-10-01 DIAGNOSIS — M778 Other enthesopathies, not elsewhere classified: Secondary | ICD-10-CM | POA: Diagnosis not present

## 2021-10-01 DIAGNOSIS — M545 Low back pain, unspecified: Secondary | ICD-10-CM

## 2021-10-01 DIAGNOSIS — R300 Dysuria: Secondary | ICD-10-CM

## 2021-10-01 DIAGNOSIS — M549 Dorsalgia, unspecified: Secondary | ICD-10-CM | POA: Diagnosis not present

## 2021-10-01 LAB — BASIC METABOLIC PANEL
BUN: 9 mg/dL (ref 6–23)
CO2: 28 mEq/L (ref 19–32)
Calcium: 9.3 mg/dL (ref 8.4–10.5)
Chloride: 104 mEq/L (ref 96–112)
Creatinine, Ser: 1.05 mg/dL (ref 0.40–1.50)
GFR: 76.32 mL/min (ref >60.00)
Glucose, Bld: 93 mg/dL (ref 70–99)
Potassium: 4.1 mEq/L (ref 3.5–5.1)
Sodium: 140 mEq/L (ref 135–145)

## 2021-10-01 LAB — POCT URINALYSIS DIPSTICK
Bilirubin, UA: NEGATIVE
Blood, UA: NEGATIVE
Glucose, UA: NEGATIVE
Ketones, UA: NEGATIVE
Leukocytes, UA: NEGATIVE
Nitrite, UA: NEGATIVE
Protein, UA: POSITIVE — AB
Spec Grav, UA: >=1.03 — AB (ref 1.010–1.025)
Urobilinogen, UA: NEGATIVE E.U./dL — AB
pH, UA: 6 (ref 5.0–8.0)

## 2021-10-01 LAB — CBC WITH DIFFERENTIAL/PLATELET
Basophils Absolute: 0 10*3/uL (ref 0.0–0.1)
Basophils Relative: 0.8 % (ref 0.0–3.0)
Eosinophils Absolute: 0.1 10*3/uL (ref 0.0–0.7)
Eosinophils Relative: 1.6 % (ref 0.0–5.0)
HCT: 42.4 % (ref 39.0–52.0)
Hemoglobin: 14.2 g/dL (ref 13.0–17.0)
Lymphocytes Relative: 63.8 % — ABNORMAL HIGH (ref 12.0–46.0)
Lymphs Abs: 3.4 10*3/uL (ref 0.7–4.0)
MCHC: 33.5 g/dL (ref 30.0–36.0)
MCV: 85.6 fl (ref 78.0–100.0)
Monocytes Absolute: 0.5 10*3/uL (ref 0.1–1.0)
Monocytes Relative: 8.5 % (ref 3.0–12.0)
Neutro Abs: 1.3 10*3/uL — ABNORMAL LOW (ref 1.4–7.7)
Neutrophils Relative %: 25.3 % — ABNORMAL LOW (ref 43.0–77.0)
Platelets: 235 10*3/uL (ref 150.0–400.0)
RBC: 4.95 Mil/uL (ref 4.22–5.81)
RDW: 13.2 % (ref 11.5–15.5)
WBC: 5.3 10*3/uL (ref 4.0–10.5)

## 2021-10-01 LAB — VITAMIN B12: Vitamin B-12: 605 pg/mL (ref 211–911)

## 2021-10-01 NOTE — Progress Notes (Signed)
Subjective:    Patient ID: Sean Harper, male    DOB: 1961-07-29, 61 y.o.   MRN: [id number]  Chief Complaint  Patient presents with   Back Pain    Started in lower left side, moved to upper left shoulder. When taking a deep breathe it is painful. 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    HPI Patient was seen today for acute concern.  Patient endorses low back pain that moved up to his shoulder when taking a deep breath in.  Symptoms started last Sunday/Monday.  At the time pain was noted 10/10 but has since decreased.  Patient took Tylenol.  Patient notes prior to symptoms having a productive cough x2 days that resolved after taking ginger, vitamin C, honey.  Denies CP, heartburn, weakness in upper extremities, bloating.  Endorses slight dysuria after finishing stream.  Patient also notes an occasional numbness in left forearm/AC fossa.  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  Denies numbness or tingling in fingers, swelling, erythema.  Past Medical History:  Diagnosis Date   Tuberculosis    Positive PPD    No Known Allergies  ROS General: Denies fever, chills, night sweats, changes in weight, changes in appetite HEENT: Denies headaches, ear pain, changes in vision, rhinorrhea, sore throat CV: Denies CP, palpitations, SOB, orthopnea Pulm: Denies SOB, cough, wheezing GI: Denies abdominal pain, nausea, vomiting, diarrhea, constipation GU: Denies dysuria, hematuria, frequency Msk: Denies muscle cramps, joint pains +back pain Neuro: Denies weakness, numbness, tingling + numbness in left forearm Skin: Denies rashes, bruising Psych: Denies depression, anxiety, hallucinations     Objective:    Blood pressure 122/84, pulse 61, temperature 97.7 F (36.5 C), temperature source Oral, weight 206 lb 3.2 oz (93.5 kg), SpO2 99 %.  Gen. Pleasant, well-nourished, in no distress, normal affect   HEENT: [place]/AT, face symmetric,  conjunctiva clear, no scleral icterus, PERRLA, EOMI, nares patent without drainage Lungs: no accessory muscle use, CTAB, no wheezes or rales Cardiovascular: RRR, no m/r/g, no peripheral edema Musculoskeletal: No TTP of cervical, thoracic, lumbar, or paraspinal muscles.  No pain with flexion and extension of spine.  Pain in left lower lumbar back with R lateral flexion spine.  TTP of left medial AC fossa with a thickness of tendon appreciated.  No deformities, no cyanosis or clubbing, normal tone Neuro:  A&Ox3, CN II-XII intact, normal gait Skin:  Warm, no lesions/ rash   Wt Readings from Last 3 Encounters:  10/01/21 206 lb 3.2 oz (93.5 kg)  07/23/21 209 lb 3.2 oz (94.9 kg)  03/25/21 206 lb 6.4 oz (93.6 kg)    Lab Results  Component Value Date   WBC 5.4 03/25/2021   HGB 13.7 03/25/2021   HCT 39.2 03/25/2021   PLT 231.0 03/25/2021   GLUCOSE 93 03/25/2021   CHOL 168 03/25/2021   TRIG 236.0 (H) 03/25/2021   HDL 37.00 (L) 03/25/2021   LDLDIRECT 88.0 03/25/2021   LDLCALC 116 (H) 12/05/2019   ALT 28 10/10/2018   AST 31 10/10/2018   NA 138 03/25/2021   K 3.9 03/25/2021   CL 104 03/25/2021   CREATININE 1.06 03/25/2021   BUN 13 03/25/2021   CO2 27 03/25/2021   TSH 1.47 03/25/2021   PSA 0.50 03/25/2021   HGBA1C 5.9 03/25/2021    Assessment/Plan:  Acute left-sided low back pain without sciatica  -discussed possible causes including musculoskeletal pain 2/2 strain from coughing and riding forklift, UTI, PNA -UA with  SG 1.030 -Lungs clear on exam -Discussed heat, ice, rest, hydration, topical analgesics, Tylenol or NSAIDs -Continue stretching exercises daily - Plan: POCT urinalysis dipstick, CBC, BMP  Upper back pain -Supportive care as discussed above  Dysuria  -UA with SG 1.030 -Increased hydration encouraged - Plan: POCT urinalysis dipstick  Tendinitis -Likely 2/2 overuse as 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  Consider compression band. -Continue to monitor -Plan: Vitamin B12, CBC  F/u prn  Grier Mitts, MD

## 2021-10-09 ENCOUNTER — Telehealth: Payer: Self-pay

## 2021-10-09 NOTE — Telephone Encounter (Signed)
Patient called and was given lab results verbalized understanding

## 2022-07-29 ENCOUNTER — Telehealth: Payer: Self-pay

## 2022-07-29 NOTE — Telephone Encounter (Signed)
---  caller states his left middle finger is swollen and painful. has been swollen for about a week, no injury that he can recall. no fever.   07/29/2022 7:28:35 AM Go to ED Now (or PCP triage) Daphine Deutscher, RN, [facility]  Referrals REFERRED TO PCP OFFICE  Pt scheduled appt with PCP for 07/30/22 at 0800

## 2022-07-30 ENCOUNTER — Encounter: Payer: Self-pay | Admitting: Family Medicine

## 2022-07-30 ENCOUNTER — Ambulatory Visit (INDEPENDENT_AMBULATORY_CARE_PROVIDER_SITE_OTHER): Payer: 59 | Admitting: Family Medicine

## 2022-07-30 VITALS — BP 120/80 | HR 52 | Temp 97.7°F | Ht 71.0 in | Wt 210.2 lb

## 2022-07-30 DIAGNOSIS — L03012 Cellulitis of left finger: Secondary | ICD-10-CM | POA: Diagnosis not present

## 2022-07-30 MED ORDER — AMOXICILLIN-POT CLAVULANATE 500-125 MG PO TABS: 1.0000 | ORAL_TABLET | Freq: Two times a day (BID) | ORAL | 0 refills | Status: AC

## 2022-07-30 NOTE — Progress Notes (Signed)
Subjective:    Patient ID: Sean Harper, male    DOB: Dec 03, 1960, 61 y.o.   MRN: [id number]  Chief Complaint  Patient presents with   finger swelling    Left hand, middle finger, x a week    HPI Patient is a 61 yo male who was seen today for acute concern.  Pt with bump and soreness of L middle finger x 1 wk.  Was initially only sore.  Then developed a bump around nailbed in the last 3 days and mild edema.  Tried using alcohol and henna on the finger.  Denies fever, chills, n/v, decreased ROM.  Past Medical History:  Diagnosis Date   Tuberculosis    Positive PPD    No Known Allergies  ROS General: Denies fever, chills, night sweats, changes in weight, changes in appetite HEENT: Denies headaches, ear pain, changes in vision, rhinorrhea, sore throat CV: Denies CP, palpitations, SOB, orthopnea Pulm: Denies SOB, cough, wheezing GI: Denies abdominal pain, nausea, vomiting, diarrhea, constipation GU: Denies dysuria, hematuria, frequency, Msk: Denies muscle cramps, joint pains  + mild tightness when bending right middle finger Neuro: Denies weakness, numbness, tingling Skin: Denies rashes, bruising + bump on left middle finger Psych: Denies depression, anxiety, hallucinations      Objective:    Blood pressure 120/80, pulse (!) 52, temperature 97.7 F (36.5 C), temperature source Oral, height 5\' 11"  (1.803 m), weight 210 lb 4 oz (95.4 kg), SpO2 99 %.   Gen. Pleasant, well-nourished, in no distress, normal affect   HEENT: [place]/AT, face symmetric, conjunctiva clear, no scleral icterus, PERRLA, EOMI, nares patent without drainage Lungs: no accessory muscle use Cardiovascular: RRR, no peripheral edema Musculoskeletal: Normal ROM of left middle finger.  No deformities, no cyanosis or clubbing, normal tone Neuro:  A&Ox3, CN II-XII intact, normal gait Skin:  Warm, dry, intact, no rash.  Moderate sized, fluctuant pustule surrounding left middle finger at the cuticle with mild erythema.  No  TTP.   Wt Readings from Last 3 Encounters:  07/30/22 210 lb 4 oz (95.4 kg)  10/01/21 206 lb 3.2 oz (93.5 kg)  07/23/21 209 lb 3.2 oz (94.9 kg)    Lab Results  Component Value Date   WBC 5.3 10/01/2021   HGB 14.2 10/01/2021   HCT 42.4 10/01/2021   PLT 235.0 10/01/2021   GLUCOSE 93 10/01/2021   CHOL 168 03/25/2021   TRIG 236.0 (H) 03/25/2021   HDL 37.00 (L) 03/25/2021   LDLDIRECT 88.0 03/25/2021   LDLCALC 116 (H) 12/05/2019   ALT 28 10/10/2018   AST 31 10/10/2018   NA 140 10/01/2021   K 4.1 10/01/2021   CL 104 10/01/2021   CREATININE 1.05 10/01/2021   BUN 9 10/01/2021   CO2 28 10/01/2021   TSH 1.47 03/25/2021   PSA 0.50 03/25/2021   HGBA1C 5.9 03/25/2021    Assessment/Plan:  Paronychia of finger of left hand - Plan: amoxicillin-clavulanate (AUGMENTIN) 500-125 MG tablet  Procedure note I&D: Paronychia of left middle finger.  Consent obtained.  Skin cleaned with alcohol.   Sterile needle used to break across skin to drain pustule.  Pustule successfully drained with purulent yellowish-green/white fluid expelled.  Rx for Augmentin sent to patient's pharmacy.  Discussed using warm compresses at home.  Tylenol or ibuprofen as needed for pain/discomfort.  Discussed s/s of worsening infection.  Given strict precautions.  F/u as needed.  Patient to schedule CPE in the next few weeks.  03/27/2021, MD

## 2022-08-13 ENCOUNTER — Ambulatory Visit (INDEPENDENT_AMBULATORY_CARE_PROVIDER_SITE_OTHER): Payer: 59 | Admitting: Family Medicine

## 2022-08-13 VITALS — BP 116/78 | HR 60 | Temp 98.3°F | Ht 70.0 in | Wt 209.4 lb

## 2022-08-13 DIAGNOSIS — Z0000 Encounter for general adult medical examination without abnormal findings: Secondary | ICD-10-CM

## 2022-08-13 DIAGNOSIS — E782 Mixed hyperlipidemia: Secondary | ICD-10-CM

## 2022-08-13 DIAGNOSIS — Z125 Encounter for screening for malignant neoplasm of prostate: Secondary | ICD-10-CM

## 2022-08-13 DIAGNOSIS — E559 Vitamin D deficiency, unspecified: Secondary | ICD-10-CM | POA: Diagnosis not present

## 2022-08-13 LAB — VITAMIN D 25 HYDROXY (VIT D DEFICIENCY, FRACTURES): VITD: 11.89 ng/mL — ABNORMAL LOW (ref 30.00–100.00)

## 2022-08-13 LAB — BASIC METABOLIC PANEL
BUN: 14 mg/dL (ref 6–23)
CO2: 27 mEq/L (ref 19–32)
Calcium: 8.7 mg/dL (ref 8.4–10.5)
Chloride: 103 mEq/L (ref 96–112)
Creatinine, Ser: 0.95 mg/dL (ref 0.40–1.50)
GFR: 85.54 mL/min (ref >60.00)
Glucose, Bld: 93 mg/dL (ref 70–99)
Potassium: 3.7 mEq/L (ref 3.5–5.1)
Sodium: 138 mEq/L (ref 135–145)

## 2022-08-13 LAB — CBC WITH DIFFERENTIAL/PLATELET
Basophils Absolute: 0 10*3/uL (ref 0.0–0.1)
Basophils Relative: 1 % (ref 0.0–3.0)
Eosinophils Absolute: 0.1 10*3/uL (ref 0.0–0.7)
Eosinophils Relative: 1.6 % (ref 0.0–5.0)
HCT: 41.8 % (ref 39.0–52.0)
Hemoglobin: 14.3 g/dL (ref 13.0–17.0)
Lymphocytes Relative: 60.9 % — ABNORMAL HIGH (ref 12.0–46.0)
Lymphs Abs: 2.8 10*3/uL (ref 0.7–4.0)
MCHC: 34.1 g/dL (ref 30.0–36.0)
MCV: 84.9 fl (ref 78.0–100.0)
Monocytes Absolute: 0.5 10*3/uL (ref 0.1–1.0)
Monocytes Relative: 10.7 % (ref 3.0–12.0)
Neutro Abs: 1.2 10*3/uL — ABNORMAL LOW (ref 1.4–7.7)
Neutrophils Relative %: 25.8 % — ABNORMAL LOW (ref 43.0–77.0)
Platelets: 244 10*3/uL (ref 150.0–400.0)
RBC: 4.92 Mil/uL (ref 4.22–5.81)
RDW: 13.1 % (ref 11.5–15.5)
WBC: 4.5 10*3/uL (ref 4.0–10.5)

## 2022-08-13 LAB — LIPID PANEL
Cholesterol: 160 mg/dL (ref 0–200)
HDL: 35.3 mg/dL — ABNORMAL LOW (ref >39.00)
LDL Cholesterol: 87 mg/dL (ref 0–99)
NonHDL: 124.37
Total CHOL/HDL Ratio: 5
Triglycerides: 188 mg/dL — ABNORMAL HIGH (ref 0.0–149.0)
VLDL: 37.6 mg/dL (ref 0.0–40.0)

## 2022-08-13 LAB — PSA: PSA: 0.44 ng/mL (ref 0.10–4.00)

## 2022-08-13 LAB — HEMOGLOBIN A1C: Hgb A1c MFr Bld: 6 % (ref 4.6–6.5)

## 2022-08-13 MED ORDER — VITAMIN D (ERGOCALCIFEROL) 1.25 MG (50000 UNIT) PO CAPS: 50000.0000 [IU] | ORAL_CAPSULE | ORAL | 0 refills | Status: DC

## 2022-08-13 NOTE — Progress Notes (Signed)
Subjective:     Sean Harper is a 61 y.o. male and is here for a comprehensive physical exam. The patient reports doing well.  Finger feeling better since having paronychia drained 07/30/22. Has a small area of soreness but no drainage.  Normal ROM of left middle finger and hand.  Patient has history of vitamin D deficiency.  After completing ergocalciferol x 12 weeks took OTC vitamin D intermittently.  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  Substance and Sexual Activity   Alcohol use: No   Drug use: No   Sexual activity: Not on file  Other Topics Concern   Not on file  Social History Narrative   Not on file   Social Determinants of Health   Financial Resource Strain: Not on file  Food Insecurity: Not on file  Transportation Needs: Not on file  Physical Activity: Not on file  Stress: Not on file  Social Connections: Not on file  Intimate Partner Violence: Not on file   Health Maintenance  Topic Date Due   COVID-19 Vaccine (2 - 2023-24 season) 08/29/2022 (Originally 05/14/2022)   Zoster Vaccines- Shingrix (1 of 2) 11/12/2022 (Originally 09/13/2009)   INFLUENZA VACCINE  12/12/2022 (Originally 04/13/2022)   COLONOSCOPY (Pts 45-23yrs Insurance coverage will need to be confirmed)  01/13/2026   Hepatitis C Screening  Completed   HIV Screening  Completed   HPV VACCINES  Aged Out   DTaP/Tdap/Td  Discontinued    The following portions of the patient's history were reviewed and updated as appropriate: allergies, current medications, past family history, past medical history, past social history, past surgical history, and problem list.  Review of  Systems Pertinent items noted in HPI and remainder of comprehensive ROS otherwise negative.   Objective:    BP 116/78 (BP Location: Right Arm, Patient Position: Sitting, Cuff Size: Large)   Pulse 60   Temp 98.3 F (36.8 C) (Oral)   Ht 5\' 10"  (1.778 m)   Wt 209 lb 6.4 oz (95 kg)   SpO2 98%   BMI 30.05 kg/m  General appearance: alert, cooperative, and no distress Head: Normocephalic, without obvious abnormality, atraumatic Eyes: conjunctivae/corneas clear. PERRL, EOM's intact. Fundi benign. Ears: normal TM's and external ear canals both ears Nose: Nares normal. Septum midline. Mucosa normal. No drainage or sinus tenderness. Throat: lips, mucosa, and tongue normal; teeth and gums normal Neck: no adenopathy, no carotid bruit, no JVD, supple, symmetrical, trachea midline, and thyroid not enlarged, symmetric, no tenderness/mass/nodules Lungs: clear to auscultation bilaterally Heart: regular rate and rhythm, S1, S2 normal, no murmur, click, rub or gallop Abdomen: soft, non-tender; bowel sounds normal; no masses,  no organomegaly Extremities: extremities normal, atraumatic, no cyanosis or edema Pulses: 2+ and symmetric Skin: Skin color, texture, turgor normal. No rashes or lesions no edema, erythema, drainage of left third digit of hand.  Mild TTP of lateral side of left third digit nailbed with dry skin surrounding.  No purulence expressed. Lymph nodes: Cervical, supraclavicular, and axillary nodes normal. Neurologic: Alert and oriented X 3, normal strength and tone. Normal symmetric reflexes.  Normal coordination and gait    Assessment:    Healthy male exam.     Plan:    Anticipatory guidance given including wearing seatbelts, smoke detectors in the home, increasing physical activity, increasing p.o. intake of water and vegetables. -labs -immunizations reviewed.  Pt declines influenza vaccine.  Patient thinks he had Tdap done within the last 10 years.  Will review  records. -colonoscopy done 01/14/2016 -given handout -Next CPE in 1 year See After Visit Summary for Counseling Recommendations  Well adult exam - Plan: CBC with Differential/Platelet, Basic metabolic panel, Hemoglobin A1c, Lipid panel  Screening for prostate cancer  - Plan: PSA  Vitamin D deficiency  - Plan: Vitamin D, 25-hydroxy  Mixed hyperlipidemia  -Lifestyle modifications -LDL 116, triglycerides 236, cholesterol 168, HDL 37 on 03/25/2021 - Plan: Lipid panel  F/u prn  Abbe Amsterdam, MD

## 2022-08-16 ENCOUNTER — Other Ambulatory Visit: Payer: Self-pay | Admitting: Family Medicine

## 2022-08-16 DIAGNOSIS — E559 Vitamin D deficiency, unspecified: Secondary | ICD-10-CM

## 2022-08-16 MED ORDER — VITAMIN D (ERGOCALCIFEROL) 1.25 MG (50000 UNIT) PO CAPS: 50000.0000 [IU] | ORAL_CAPSULE | ORAL | 0 refills | Status: AC

## 2022-08-20 ENCOUNTER — Telehealth: Payer: Self-pay | Admitting: Family Medicine

## 2022-08-20 NOTE — Telephone Encounter (Signed)
Patient was inform of his lab result.

## 2022-08-20 NOTE — Telephone Encounter (Signed)
Pt is calling and would like blood work results 

## 2022-12-13 ENCOUNTER — Ambulatory Visit: Payer: BLUE CROSS/BLUE SHIELD | Admitting: Family Medicine

## 2022-12-13 ENCOUNTER — Telehealth: Payer: Self-pay | Admitting: Family Medicine

## 2022-12-13 NOTE — Telephone Encounter (Signed)
Pt left without being seen on 12/13/22 for an OV with Dr Ethelene Hal. He has [place], so I took him off the [employer].

## 2022-12-22 ENCOUNTER — Ambulatory Visit (INDEPENDENT_AMBULATORY_CARE_PROVIDER_SITE_OTHER): Payer: BLUE CROSS/BLUE SHIELD | Admitting: Family Medicine

## 2022-12-22 ENCOUNTER — Encounter: Payer: Self-pay | Admitting: Family Medicine

## 2022-12-22 VITALS — BP 120/80 | HR 61 | Temp 98.4°F | Ht 70.0 in | Wt 205.0 lb

## 2022-12-22 DIAGNOSIS — Z029 Encounter for administrative examinations, unspecified: Secondary | ICD-10-CM

## 2022-12-22 NOTE — Progress Notes (Signed)
   Established Patient Office Visit   Subjective  Patient ID: Sean Harper, male    DOB: 05/16/1961  Age: 62 y.o. MRN: [id number]  Chief Complaint  Patient presents with   Discuss referral    His insurance has changed from [employer] to [employer]. Unsure if his insurance will cover current pcp    Patient is a 62 year old male who presents for clarification.  Patient states he is trying to make sure he has a new insurance is covered by our office.  Patient has [employer] local.  States was initially told he can keep his current provider.  Tried to schedule an acute visit last wk, but this provider did not have openings.  Pt was sent to [facility] office only for appt to be cancelled as they would not accept his insurance.      ROS Negative unless stated above    Objective:     BP 120/80   Pulse 61   Temp 98.4 F (36.9 C) (Oral)   Ht 5\' 10"  (1.778 m)   Wt 205 lb (93 kg)   SpO2 97%   BMI 29.41 kg/m    Physical Exam Constitutional:      Appearance: Normal appearance.  HENT:     Head: Normocephalic and atraumatic.     Mouth/Throat:     Mouth: Mucous membranes are moist.  Eyes:     Conjunctiva/sclera: Conjunctivae normal.  Skin:    General: Skin is warm and dry.  Neurological:     Mental Status: He is alert and oriented to person, place, and time. Mental status is at baseline.      No results found for any visits on 12/22/22.    Assessment & Plan:  Administrative encounter   No charge for visit.  Pt to contact insurance company in regards to being able to keep current provider(s).  Will up date clinic prn.  No follow-ups on file.   [name], MD

## 2022-12-24 ENCOUNTER — Telehealth: Payer: Self-pay | Admitting: Family Medicine

## 2022-12-24 NOTE — Telephone Encounter (Signed)
Pt states he is returning a call from Dr Salomon Fick

## 2023-04-06 ENCOUNTER — Telehealth: Payer: Self-pay

## 2023-04-06 NOTE — Telephone Encounter (Signed)
Attempted to contact patient. No working #. AS, CMA 

## 2023-04-28 ENCOUNTER — Telehealth: Payer: Self-pay

## 2023-04-28 NOTE — Telephone Encounter (Signed)
No working #. AS, CMA 

## 2024-03-13 ENCOUNTER — Telehealth: Payer: Self-pay | Admitting: Family Medicine

## 2024-03-13 NOTE — Telephone Encounter (Unsigned)
 Copied from CRM [id number]. Topic: Clinical - Medication Refill >> Mar 13, 2024  2:44 PM Zenovia J wrote: Medication: Nystatin  cream  Has the patient contacted their pharmacy? No (Agent: If no, request that the patient contact the pharmacy for the refill. If patient does not wish to contact the pharmacy document the reason why and proceed with request.) (Agent: If yes, when and what did the pharmacy advise?)    [id number] [place], [place] - [address] AT [facility] & [address] [place] [address] Phone: [phone] Fax: [phone]  Is this the correct pharmacy for this prescription? Yes If no, delete pharmacy and type the correct one.   Has the prescription been filled recently? No  Is the patient out of the medication? Yes  Has the patient been seen for an appointment in the last year OR does the patient have an upcoming appointment? Yes  Can we respond through MyChart? Yes  Agent: Please be advised that Rx refills may take up to 3 business days. We ask that you follow-up with your pharmacy.

## 2024-03-28 ENCOUNTER — Encounter: Payer: Self-pay | Admitting: Family Medicine

## 2024-03-30 ENCOUNTER — Telehealth: Payer: Self-pay | Admitting: Internal Medicine

## 2024-03-30 NOTE — Telephone Encounter (Signed)
 Copied from CRM [id number]. Topic: E2C2 Error Reporting - Scheduling Error >> Mar 28, 2024 10:02 AM Candice L wrote: Pt was scheduled for a physical with Dr Mercer on 03/13/2024 for Wednesday 03/28/2024 by Zenovia Louder, this patient has a provider by the name of Massie Sewer DO, when patient was called and informed that he would not be able to see Dr Mercer for this appointment he stated he took two days off from work cause he works nights and he should have been informed of this before his appointment, this appointment should not have been made with Dr Mercer as a physical but as a new patient as the patient did say he wanted to see Dr Mercer again, the patient refused to make a new patient appointment when I offered to make him one.  Pt then came into the office very upset that the appt was made incorrectly and that he took off work to come to this appt. He stated when he spoke with the agent they told him that he could have his Phys with another provider since his original PCP no longer takes his INS. Explained to pt that just because we take his INS doesn't mean we are able to complete his physical since he is not a pt of our office. Offered him to sched a NP appt and he declined.   >> Mar 30, 2024 11:19 AM Antonio DEL wrote: Thank you for submitting this issue for investigation. After a thorough review, we have determined that an error was made by an [facility] team member. We have addressed the matter directly with the agent involved. We appreciate you bringing this to our attention.  Error/Investigation Details: Call ID: [id number]. After listening to the call, the patient did not tell the specialist anything about original provider no longer accepting his insurance. There actually was no mention of insurance on the call at all. Patient called in requesting to have a physical and asked if Dr. Mercer was still at the practice. Specialist checked past appointments and saw the patient previously was a patient of Dr.  Mercer and proceeded with scheduling him. Physicals are to be with PCP (DO Massie Lockwood was listed as patient's PCP) and patient was no longer established with Dr. Mercer so new patient appointment should have been scheduled. Specialist selected the option continue scheduling with PCP instead of selecting the option to establish with new provider when scheduling the patient which prompted incorrect visit type.   FYI
